# Patient Record
Sex: Male | Born: 1945 | Race: White | Hispanic: No | Marital: Married | Smoking: Former smoker
Health system: Southern US, Community
[De-identification: ages and names within clinical notes are randomized; demographics above are authoritative.]

## PROBLEM LIST (undated history)

## (undated) DIAGNOSIS — F411 Generalized anxiety disorder: Secondary | ICD-10-CM

## (undated) DIAGNOSIS — E559 Vitamin D deficiency, unspecified: Secondary | ICD-10-CM

## (undated) DIAGNOSIS — I4891 Unspecified atrial fibrillation: Secondary | ICD-10-CM

## (undated) DIAGNOSIS — E119 Type 2 diabetes mellitus without complications: Secondary | ICD-10-CM

## (undated) DIAGNOSIS — H6123 Impacted cerumen, bilateral: Secondary | ICD-10-CM

## (undated) DIAGNOSIS — I38 Endocarditis, valve unspecified: Secondary | ICD-10-CM

## (undated) DIAGNOSIS — J309 Allergic rhinitis, unspecified: Secondary | ICD-10-CM

## (undated) DIAGNOSIS — K219 Gastro-esophageal reflux disease without esophagitis: Secondary | ICD-10-CM

## (undated) DIAGNOSIS — J45909 Unspecified asthma, uncomplicated: Secondary | ICD-10-CM

## (undated) DIAGNOSIS — N189 Chronic kidney disease, unspecified: Secondary | ICD-10-CM

## (undated) DIAGNOSIS — E871 Hypo-osmolality and hyponatremia: Secondary | ICD-10-CM

## (undated) DIAGNOSIS — E538 Deficiency of other specified B group vitamins: Secondary | ICD-10-CM

## (undated) DIAGNOSIS — J449 Chronic obstructive pulmonary disease, unspecified: Secondary | ICD-10-CM

## (undated) DIAGNOSIS — E876 Hypokalemia: Secondary | ICD-10-CM

## (undated) DIAGNOSIS — D509 Iron deficiency anemia, unspecified: Secondary | ICD-10-CM

## (undated) DIAGNOSIS — I10 Essential (primary) hypertension: Secondary | ICD-10-CM

## (undated) DIAGNOSIS — E7800 Pure hypercholesterolemia, unspecified: Secondary | ICD-10-CM

## (undated) DIAGNOSIS — H1010 Acute atopic conjunctivitis, unspecified eye: Secondary | ICD-10-CM

## (undated) HISTORY — PX: TONSILLECTOMY: SUR1361

## (undated) HISTORY — DX: Unspecified atrial fibrillation: I48.91

## (undated) HISTORY — DX: Gastro-esophageal reflux disease without esophagitis: K21.9

## (undated) HISTORY — DX: Acute atopic conjunctivitis, unspecified eye: J30.9

## (undated) HISTORY — PX: CHOLECYSTECTOMY: SHX55

## (undated) HISTORY — DX: Acute atopic conjunctivitis, unspecified eye: H10.10

## (undated) HISTORY — DX: Unspecified asthma, uncomplicated: J45.909

## (undated) HISTORY — DX: Essential (primary) hypertension: I10

## (undated) HISTORY — DX: Hypokalemia: E87.6

## (undated) HISTORY — DX: Type 2 diabetes mellitus without complications (CMS HCC): E11.9

## (undated) HISTORY — PX: HX CAROTID ENDARTERECTOMY: SHX38

## (undated) HISTORY — DX: Endocarditis, valve unspecified: I38

## (undated) HISTORY — DX: Generalized anxiety disorder: F41.1

## (undated) HISTORY — DX: Chronic kidney disease, unspecified: N18.9

## (undated) HISTORY — DX: Impacted cerumen, bilateral: H61.23

## (undated) HISTORY — DX: Unspecified atrial fibrillation (CMS HCC): I48.91

## (undated) HISTORY — DX: Pure hypercholesterolemia, unspecified: E78.00

## (undated) HISTORY — DX: Hypo-osmolality and hyponatremia: E87.1

## (undated) HISTORY — DX: Chronic obstructive pulmonary disease, unspecified (CMS HCC): J44.9

## (undated) HISTORY — PX: HX GALL BLADDER SURGERY/CHOLE: SHX55

## (undated) HISTORY — DX: Iron deficiency anemia, unspecified: D50.9

## (undated) HISTORY — DX: Deficiency of other specified B group vitamins: E53.8

## (undated) HISTORY — DX: Vitamin D deficiency, unspecified: E55.9

## (undated) HISTORY — DX: Allergic rhinitis, unspecified: J30.9

## (undated) NOTE — Progress Notes (Signed)
Formatting of this note is different from the original.  Images from the original note were not included.      VASCULAR SURGERY [facility]    Patient Name: Frank Barton  Encounter Date: 02/03/2022  Referring provider: Curcio  Surgeon:  Mitchel Honour, MD    ASSESSMENT     10 y.o. male with history of symptomatic  carotid stenosis. Imaging shows <40 % R/L ICA stenosis. Frank Barton has not had recurrence of symptoms and his imaging after 3 years remains stable. Follow-up as needed, as no concern for plaque buildup or other vascular pathologies.     PLAN     Follow up as needed.  Medication management:  - Statin: rosuvastatin 20 mg  - Antiplatelet and anticoagulation therapy: eliquis 5 mg   - BP and diabetic management per PCP. Goal A1c <7%.    HISTORY OF PRESENT ILLNESS      Frank Barton is a 53 y.o. 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  Patient reports he has been doing well the past 3 years. He endorses some new and increased word finding difficulties over the past 1 year. Additionally, he notes intermittent lightheadedness with standing, which will cause him to loose his balance, but denies falls or difficulty with ambulating. He works outside daily and sometimes with physical activity he notes some chest tightness and shortness of breath. He currently follows with cardiology in [place]. Also notes some RLE edema that occurs sporadically, usually at the end of the summer. Patient denies ischemic symptoms including headache, amaurosis fugax, focal weakness, slurred speech. Currently using chewing tobacco daily, former tobacco smoker. No alcohol or other substance use.     ROS: The balance of 10 systems reviewed is negative except as noted in the HPI     RX/ ALLERGIES/ MED HX/SURG HX/ SOC HX/FAM HX: Reviewed & updated in Epic    Current Outpatient Medications   Medication Instructions    amLODIPine (NORVASC) 2.5 mg, Oral, Daily     azelastine (ASTELIN) 137 mcg (0.1 %) nasal spray 1 spray, Each Nare, 2 times a day (standard), Use in each nostril as directed     beclomethasone (QVAR) 80 mcg/actuation inhaler 2 puffs, Inhalation, 2 times a day (standard)    C,E,ZINC,COPPER 11/OMEGA3S/LUT (OCUVITE ADULT 50+ ORAL) 1 tablet, Oral, Daily (standard)    candesartan (ATACAND) 32 mg, Oral, Daily (standard)    cholecalciferol (vitamin D3-25 mcg (1,000 unit)) (VITAMIN D3) 1,000 Units, Oral, Daily (standard)    cyanocobalamin (VITAMIN B-12) 500 mcg, Oral, Daily (standard)    dronedarone (MULTAQ) 400 mg, Oral, 2 times a day with meals    ELIQUIS 5 mg, Oral, 2 times a day (standard)    fluticasone (FLONASE) 50 mcg/actuation nasal spray 1 spray, Each Nare, Daily (standard)    IRON, FERROUS SULFATE, ORAL 1 tablet, Oral, Daily (standard)    metoprolol succinate (TOPROL-XL) 50 mg, Oral, Daily (standard)    montelukast (SINGULAIR) 10 mg, Oral, Nightly    nitroglycerin (NITROSTAT) 0.4 mg, Sublingual, Every 5 min PRN    omeprazole (PRILOSEC) 40 mg, Oral, Daily (standard)    ranitidine (ZANTAC) 300 mg, Oral, Every evening    rosuvastatin (CRESTOR) 20 mg, Oral, Daily (standard)       PHYSICAL EXAM      Vitals:    02/03/22 1229   BP: 139/52   Pulse: 61   Temp: 36.4 C (97.5 F)     General: well  appearing, no acute distress   Head: normocephalic, atraumatic  Neck: Supple, carotid bruit(s)  absent bilateral  Cardiac: RRR, distant heart sounds  Lungs: Easy work of breathing, CTA Bilaterally  Abdomen: Soft, non-distended, non tender, without mass  MSK: negative joint tenderness, normal gait and ROM  Skin: warm, dry intact, good turgor  Neuro: AAO x 3, appropriate to questions. CN II-XII grossly intact.  Extremities: Upper extremities warm and perfused. Right lower extremity with 1+ pitting edema to the mid-shin, no erythema or tenderness. Left lower extremity  negative for edema, ulceration or digital gangrene.   Pulses: Bilateral radial pulses palpable.    DIAGNOSTICS       Images and reports reviewed independently by Dr. Karie Mainland.     Bilateral Carotid Duplex (02/03/2022):    Labs:  Lab Results   Component Value Date    CREATININE 1.32 (H) 09/15/2016     ______________________________________________________________________    Documentation assistance was provided by Eugenia Pancoast, Scribe, on February 03, 2022 at 10:21 AM. for Galen Daft, PA, and Tomasa Hose, MD    Karna Christmas, MS4  Electronically signed by Boykin Reaper, MD at 02/10/2022  2:54 PM EDT    Associated attestation - Karie Mainland Barnet Pall, MD - 02/10/2022  2:54 PM EDT  Formatting of this note might be different from the original.  I have verified all student documentation or findings.  I have personally performed or re-performed the physical exam and medical decision making.  Katharine L McGinigle

## (undated) NOTE — Progress Notes (Signed)
Formatting of this note is different from the original.    Office Visit    PCP: Lamont Dowdy, NP    Reason for Visit: Medical management of PAF      HPI:    Frank Barton is a 89 y.o. male presents for medical management of PAF. He has a history of paroxysmal atrial fibrillation, coronary calcifications on CT noted in 2010, carotid artery stenosis, asthma, chronic kidney disease, hypertension and hyperlipidemia.    He was last seen in our office on 09/26/21.    Since his last office visit he has done fairly well.  He was seen at [facility] earlier this month with concerns of his heart rate being in the 30s.  The patient reports that he was checking his blood pressure on his blood pressure cuff and pulse ox and noted that at times his heart rate would be in the 30s.  When he arrived at the hospital his EKG did demonstrate sinus rhythm with PVCs.  He contacted our office about the issue and a Zio patch was ordered.  The patient just mailed the Zio patch back yesterday so we do not have the results available.  He tells me that he does feel his heart flutter occasionally.  He has not felt any sustained arrhythmias.  He has been having issues with his asthma and currently has a dry cough.  He did see his asthma physician who gave him a different type of inhaler to use.  His shortness of breath seems to be stable.  He denies PND and orthopnea.  He is not describing any anginal type symptoms.    His blood pressure is well controlled in the office today.  This has been managed by nephrology. He remains on statin therapy and his cholesterol was well controlled when it was checked in August. He did bring labs with him today. His LDL 45    He remains in normal sinus rhythm today on Multaq. He continues on anticoagulation and denies any bleeding issues.    He tries to stay fairly active by doing yard workand feeding his goats.He enjoys hunting and fishing.He owns a home in [place] and goes  there to keep up the property.    He follows with Vascular at [facility] for his carotid disease    Assessment & Plan:    1. PAF (paroxysmal atrial fibrillation) (HCC)  s/p ECV at [facility] 2016, Remains in NSR, on eliquis, multaq    2. Hypertension, unspecified type  Well controlledin the office today. Managed by Nephrology    3. Stenosis of carotid artery, unspecified laterality  s/p R CEA 09/2016, followed at [facility]    4. Coronary artery calcification seen on CT scan  Previous nuclear scan in 2010 was negative.  On crestor--continue with risk modification    5. Mild intermittent asthma without complication  Uses inhalers, followed by Dr Lucie Leather    6. Chronic kidney disease, unspecified CKD stage  Creat 1.56 on labs from 01/14/22. This is followed by Managed by Nephrology Dr Hyman Hopes    7. Other hyperlipidemia  Lipid panel 01/14/22 LDL 34. He will continue on Crestor    8. Anticoagulant long-term use  On eliquis for AF. He denies any bleeding issues    PLAN:    [name] continues to have episodes where he feels occasional palpitations.  His Zio patch that he wore was mailed back yesterday and results are pending.  He does remain in normal sinus rhythm today  by exam.  I will contact the patient with the results of his Zio patch once I have reviewed it.  He did have lab work in August which demonstrated low magnesium level.  I will have him take magnesium oxide 400 mg twice daily for 2 weeks.    He is not describing any anginal type symptoms.    His cholesterol was well controlled when it was checked in August.  His primary care has been managing his hyperlipidemia.    He continues to follow with his nephrologist who manages his chronic kidney disease and hypertension.    I will make no further changes to his medical regimen other than adding magnesium and for 2 weeks.  I will plan to see him again in the office in 4 months unless his monitor is abnormal.    Past Medical History:  Past Medical History:   Diagnosis Date    ? AF (paroxysmal atrial fibrillation) (HCC)    ? Coronary artery disease    ? Diabetes mellitus (HCC)    ? Hyperlipidemia    ? Hypertension      Past Surgical History:  History reviewed. No pertinent surgical history.    Medications:     Current Outpatient Medications:   ?  amLODIPine (NORVASC) 5 MG tablet, Take 1 tablet (5 mg total) by mouth daily., Disp: 90 tablet, Rfl: 3  ?  apixaban (ELIQUIS) 5 mg tablet *ANTICOAGULANT*, Take 1 tablet (5 mg total) by mouth 2 times daily., Disp: 180 tablet, Rfl: 3  ?  azelastine (ASTELIN) 137 mcg (0.1 %) nasal spray, , Disp: , Rfl:   ?  candesartan (ATACAND) 32 MG tablet, Take 1 tablet (32 mg total) by mouth daily., Disp: 90 tablet, Rfl: 3  ?  cyanocobalamin (VITAMIN B12) 500 MCG tablet, Take 1 tablet (500 mcg total) by mouth., Disp: , Rfl:   ?  ergocalciferol (VITAMIN D2) 1,250 mcg (50,000 unit) capsule, TAKE 1 CAPSULE BY MOUTH EVERY WEEK FOR LOW VITAMIN DAILY, Disp: , Rfl:   ?  famotidine (PEPCID) 40 MG tablet, Take 1 tablet (40 mg total) by mouth daily., Disp: , Rfl:   ?  ferrous sulfate (FERATAB) 325 (65 FE) MG tablet, Take 1 tablet (325 mg total) by mouth daily with breakfast., Disp: , Rfl:   ?  fluticasone (FLONASE) 50 mcg/actuation nasal spray, , Disp: , Rfl:   ?  metoPROLOL succinate (TOPROL-XL) 50 MG 24 hr tablet, Take 1 tablet (50 mg total) by mouth daily., Disp: 90 tablet, Rfl: 3  ?  montelukast (SINGULAIR) 10 mg tablet, , Disp: , Rfl:   ?  MULTAQ 400 mg tablet, Take 1 tablet (400 mg total) by mouth 2 times daily with meals., Disp: 180 tablet, Rfl: 3  ?  nitroglycerin (NITROSTAT) 0.4 MG SL tablet, Place 1 tablet (0.4 mg total) under the tongue every 5 (five) minutes as needed for Chest pain. Call doctor/911 if take 2 doses. Max 3/day., Disp: 25 tablet, Rfl: 11  ?  omeprazole (PRILOSEC) 40 MG capsule, , Disp: , Rfl:   ?  QVAR REDIHALER 80 mcg/actuation inhaler, , Disp: , Rfl:   ?  rosuvastatin (CRESTOR) 20 MG tablet, Take 1 tablet (20 mg total) by mouth daily., Disp:  90 tablet, Rfl: 3  ?  XOPENEX HFA 45 mcg/actuation inhaler,  , Disp: , Rfl:     Allergies:   Allergies   Allergen Reactions   ? Latex Shortness Of Breath (ALLERGY/intolerance)   ? Latex, Natural Rubber Shortness Of  Breath (ALLERGY/intolerance)   ? Levaquin [Levofloxacin] Other (See Comments)     Hives    ? Miralax [Polyethylene Glycol 3350] Other (See Comments)     Hives    ? Polyethylene Glycol 300 Urticaria / Hives (ALLERGY)   ? Spiriva Respimat [Tiotropium Bromide] Other (See Comments) and Urticaria / Hives (ALLERGY)     hives     Social History:  Social History     Tobacco Use   Smoking Status Former   ? Years: 5   ? Types: Cigarettes   Smokeless Tobacco Current   ? Types: Chew     Social History     Substance and Sexual Activity   Alcohol Use No     Social History     Substance and Sexual Activity   Drug Use No     Family History:  History reviewed. No pertinent family history.    Review of Systems:   CONSTITUTIONAL:  As per HPI.  HEENT:  Eyes:  No diplopia or blurred vision. ENT:  No earache, sore throat or runny nose.  CARDIOVASCULAR:  No pressure, squeezing, strangling, tightness, heaviness or aching about the chest, neck, axilla or epigastrium.  RESPIRATORY:  No cough, shortness of breath. No PND or orthopnea.  GASTROINTESTINAL:  No nausea, vomiting or diarrhea.  GENITOURINARY:  No dysuria, frequency or urgency.  MUSCULOSKELETAL:  As per HPI.  SKIN:  No change in skin, hair or nails.  NEUROLOGIC:  No paresthesias, fasciculations, seizures or weakness.  PSYCHIATRIC:  No disorder of thought or mood.  ENDOCRINE:  No heat or cold intolerance, polyuria or polydipsia.  HEMATOLOGICAL:  No easy bruising or bleeding.     VITAL SIGNS:   Vitals:    04/03/22 1005   BP: 138/58   Pulse: 75   SpO2: 96%   Height: 1.727 m (5\' 8" )   Weight: 94.8 kg (209 lb)   BMI (Calculated): 31.8     Vitals:    04/03/22 1005   BP: 138/58   Pulse: 75   SpO2: 96%   Height: 1.727 m (5\' 8" )   Weight: 94.8 kg (209 lb)   BMI (Calculated): 31.8      Physical Exam:  GENERAL: pleasant elderly male seated on exam table, Appears well. NAD. A+O X 3   HEENT: Normocephalic, Anicteric.   NECK: supple, Trachea midline, Carotid upstrokes normal No JVD   RESPIRATORY: Good breath sound.  No wheezing or Rhonchi heard  COR: Normal precordium regular rhythm. Normal S1 and S2.   No murmur/rub, or gallops heard.   ABDOMEN: + BS, Soft, Non tender.   VASCULAR: Normal radial,  posterior tibial pulses bilaterally   EXTREMITIES: No cyanosis or clubbing. No edema of lower extremities.   NEURO: normal mental status, no gross focal motor deficit.   SKIN no rash   MUSCLE -SKELETAL :  full range of motion  PSYCH: normal behavior, appropriate.  Test Results  none    Labs:   Sodium   Date/Time Value Ref Range Status   09/27/2020 11:34 AM 135 135 - 146 MMOL/L Final     Potassium   Date/Time Value Ref Range Status   09/27/2020 11:34 AM 4.1 3.5 - 5.3 MMOL/L Final     BUN   Date/Time Value Ref Range Status   09/27/2020 11:34 AM 18 8 - 24 MG/DL Final     Creatinine   Date/Time Value Ref Range Status   09/27/2020 11:34 AM 1.58 (H) 0.50 - 1.50 MG/DL Final  No results found for: "INR", "PROTIME"    Electronically signed by: Evie Lacks, NP 04/03/2022 10:13 AM,   04/03/2022, 10:13 AM      Electronically signed by: Hillery Jacks, NP  04/03/22 1053    Electronically signed by Hillery Jacks, NP at 04/03/2022 10:53 AM EDT

---

## 1993-10-02 ENCOUNTER — Other Ambulatory Visit (HOSPITAL_COMMUNITY): Payer: Self-pay | Admitting: FAMILY PRACTICE

## 2005-01-22 ENCOUNTER — Ambulatory Visit: Payer: Self-pay | Admitting: Family Medicine

## 2005-02-02 ENCOUNTER — Ambulatory Visit: Payer: Self-pay | Admitting: Family Medicine

## 2005-02-10 ENCOUNTER — Ambulatory Visit: Payer: Self-pay | Admitting: Family Medicine

## 2005-04-24 ENCOUNTER — Ambulatory Visit: Payer: Self-pay | Admitting: Family Medicine

## 2015-03-04 DIAGNOSIS — E088 Diabetes mellitus due to underlying condition with unspecified complications: Secondary | ICD-10-CM | POA: Insufficient documentation

## 2015-03-04 DIAGNOSIS — E785 Hyperlipidemia, unspecified: Secondary | ICD-10-CM | POA: Insufficient documentation

## 2015-03-04 DIAGNOSIS — I10 Essential (primary) hypertension: Secondary | ICD-10-CM | POA: Insufficient documentation

## 2015-04-24 ENCOUNTER — Encounter: Payer: Self-pay | Admitting: Allergy and Immunology

## 2015-04-24 ENCOUNTER — Ambulatory Visit (INDEPENDENT_AMBULATORY_CARE_PROVIDER_SITE_OTHER): Payer: Medicare PPO | Admitting: Allergy and Immunology

## 2015-04-24 VITALS — BP 150/60 | HR 80 | Temp 97.5°F | Resp 16 | Ht 67.17 in | Wt 200.6 lb

## 2015-04-24 DIAGNOSIS — H1010 Acute atopic conjunctivitis, unspecified eye: Secondary | ICD-10-CM | POA: Diagnosis not present

## 2015-04-24 DIAGNOSIS — J309 Allergic rhinitis, unspecified: Secondary | ICD-10-CM | POA: Diagnosis not present

## 2015-04-24 DIAGNOSIS — J4531 Mild persistent asthma with (acute) exacerbation: Secondary | ICD-10-CM

## 2015-04-24 DIAGNOSIS — K219 Gastro-esophageal reflux disease without esophagitis: Secondary | ICD-10-CM

## 2015-04-24 DIAGNOSIS — J387 Other diseases of larynx: Secondary | ICD-10-CM | POA: Diagnosis not present

## 2015-04-25 DIAGNOSIS — K219 Gastro-esophageal reflux disease without esophagitis: Secondary | ICD-10-CM | POA: Insufficient documentation

## 2015-04-25 DIAGNOSIS — J4530 Mild persistent asthma, uncomplicated: Secondary | ICD-10-CM | POA: Insufficient documentation

## 2015-04-25 DIAGNOSIS — H1010 Acute atopic conjunctivitis, unspecified eye: Secondary | ICD-10-CM | POA: Insufficient documentation

## 2015-04-25 DIAGNOSIS — J309 Allergic rhinitis, unspecified: Secondary | ICD-10-CM

## 2015-04-25 NOTE — Progress Notes (Signed)
[name]  Follow-up Note  Refering Provider: No ref. provider found Primary Provider: Pcp Not In System  Subjective:   Bob Newman is a 69 y.o. male who returns to the [facility] in re-evaluation of the following:  HPI Comments:  Bob Newman returns to this clinic in evaluation of his asthma and allergic rhinitis and LPR. For the most part he is done quite well since his last visit approximate 5 months ago. Unfortunately, about 2 weeks ago he developed nasal congestion and coughing and phlegm production and possibly a little bit of yellow nasal discharge without any fever or anosmia. He's been using a short acting bronchodilator which has helped his cough somewhat. He has no chest pain or significant chest tightness or shortness of breath. His reflux is been somewhat active even while using Prilosec and ranitidine while he's been sick. Prior to this exacerbation he did not require any systemic steroids since I last seen him in his clinic and he could exert himself without any problem and rarely uses any bronchodilator.   Outpatient Encounter Prescriptions as of 04/24/2015  Medication Sig  . amLODipine (NORVASC) 5 MG tablet   . azelastine (ASTELIN) 0.1 % nasal spray   . candesartan (ATACAND) 32 MG tablet   . Cholecalciferol (VITAMIN D PO) Take by mouth.  . CRESTOR 20 MG tablet   . fluticasone (FLONASE) 50 MCG/ACT nasal spray   . montelukast (SINGULAIR) 10 MG tablet   . omeprazole (PRILOSEC) 20 MG capsule Take 20 mg by mouth daily.  Marland Kitchen. QVAR 80 MCG/ACT inhaler Inhale 2 puffs into the lungs 2 (two) times daily.  . ranitidine (ZANTAC) 300 MG tablet Take 300 mg by mouth daily.  . metoprolol succinate (TOPROL-XL) 50 MG 24 hr tablet    No facility-administered encounter medications on file as of 04/24/2015.    No orders of the defined types were placed in this encounter.    History reviewed. No pertinent past medical  history.  Past Surgical History  Procedure Laterality Date  . Cholecystectomy    . Tonsillectomy      Allergies  Allergen Reactions  . Levaquin [Levofloxacin] Hives  . Spiriva Handihaler [Tiotropium Bromide Monohydrate] Hives    Review of Systems  Constitutional: Negative.  Negative for fever and chills.  HENT: Positive for congestion and postnasal drip. Negative for ear discharge, ear pain, facial swelling, nosebleeds, rhinorrhea, sinus pressure, sneezing, sore throat, tinnitus, trouble swallowing and voice change.   Eyes: Negative.  Negative for pain, discharge, redness and itching.  Respiratory: Positive for cough. Negative for choking, chest tightness, shortness of breath, wheezing and stridor.   Cardiovascular: Negative.  Negative for chest pain and leg swelling.  Gastrointestinal: Negative.  Negative for vomiting and abdominal pain.  Musculoskeletal: Negative.  Negative for myalgias and arthralgias.  Skin: Negative.   Allergic/Immunologic: Negative.   Neurological: Negative for dizziness.  Hematological: Negative for adenopathy.     Objective:   Filed Vitals:   04/24/15 1346  BP: 150/60  Pulse: 80  Temp: 97.5 F (36.4 C)  Resp: 16   Height: 5' 7.16" (170.6 cm)  Weight: 200 lb 9.9 oz (91 kg)   Physical Exam  Constitutional: He appears well-developed and well-nourished. No distress.  HENT:  Head: Normocephalic and atraumatic. Head is without right periorbital erythema and without left periorbital erythema.  Right Ear: Tympanic membrane, external ear and ear canal normal. No drainage or tenderness. No foreign bodies. Tympanic membrane  is not injected, not scarred, not perforated, not erythematous, not retracted and not bulging. No middle ear effusion.  Left Ear: Tympanic membrane, external ear and ear canal normal. No drainage or tenderness. No foreign bodies. Tympanic membrane is not injected, not scarred, not perforated, not erythematous, not retracted and not  bulging.  No middle ear effusion.  Nose: Mucosal edema present. No rhinorrhea, nose lacerations or sinus tenderness.  No foreign bodies.  Mouth/Throat: Oropharynx is clear and moist. No oropharyngeal exudate, posterior oropharyngeal edema, posterior oropharyngeal erythema or tonsillar abscesses.  Eyes: Lids are normal. Right eye exhibits no chemosis, no discharge and no exudate. No foreign body present in the right eye. Left eye exhibits no chemosis, no discharge and no exudate. No foreign body present in the left eye. Right conjunctiva is not injected. Left conjunctiva is not injected.  Neck: Neck supple. No tracheal tenderness present. No tracheal deviation and no edema present. No thyroid mass and no thyromegaly present.  Cardiovascular: Normal rate, regular rhythm, S1 normal and S2 normal.  Exam reveals no gallop.   No murmur heard. Pulmonary/Chest: No accessory muscle usage or stridor. No respiratory distress. He has no wheezes. He has no rhonchi. He has no rales.  Abdominal: Soft.  Lymphadenopathy:       Head (right side): No tonsillar adenopathy present.       Head (left side): No tonsillar adenopathy present.    He has no cervical adenopathy.  Neurological: He is alert.  Skin: No rash noted. He is not diaphoretic.  Psychiatric: He has a normal mood and affect. His behavior is normal.    Diagnostics:    Spirometry was performed and demonstrated an FEV1 of 2.88 at 101 % of predicted.  The patient had an Asthma Control Test with the following results: ACT Total Score: 13.    Assessment and Plan:   1. Mild persistent asthma, with acute exacerbation     1. Prednisone  one time a day for 5 days  2. Continue Qvar 80 two inhalations two times per day. Increase to three inhalations three times per day during 'flare up'  3. Continue montelukast  one time per day  4. Continue flonase 1-2 sprays each nostril one time per day  5. Continue omeprazole 20 in AM + ranitidine 300  in PM  6. Continue Astelin, Proventil HFA if needed.   I will assume Briar will do well with very low-dose prednisone over the course of the next 5 days while we wait for his upper respiratory tract infection that appears to have precipitated a slight asthma flare resolve. If he does well we can see him back in this clinic in 6 months or earlier if there is a problem.    Laurette Schimke, MD [facility]

## 2015-04-25 NOTE — Patient Instructions (Signed)
1. Prednisone 10mg  one time a day for 5 days  2. Continue Qvar 80 two inhalations two times per day. Increase to three inhalations three times per day during 'flare up'  3. Continue montelukast 10mg  one time per day  4. Continue flonase 1-2 sprays each nostril one time per day  5. Continue omeprazole 20 in AM + ranitidine 300 in PM  6. Continue Astelin, Proventil HFA if needed.

## 2015-05-20 ENCOUNTER — Ambulatory Visit: Payer: Self-pay | Admitting: Allergy and Immunology

## 2015-05-20 ENCOUNTER — Ambulatory Visit (INDEPENDENT_AMBULATORY_CARE_PROVIDER_SITE_OTHER): Payer: Medicare PPO | Admitting: Allergy and Immunology

## 2015-05-20 ENCOUNTER — Encounter: Payer: Self-pay | Admitting: Allergy and Immunology

## 2015-05-20 VITALS — BP 140/80 | HR 80 | Temp 97.6°F | Resp 18

## 2015-05-20 DIAGNOSIS — H1010 Acute atopic conjunctivitis, unspecified eye: Secondary | ICD-10-CM | POA: Diagnosis not present

## 2015-05-20 DIAGNOSIS — J4530 Mild persistent asthma, uncomplicated: Secondary | ICD-10-CM

## 2015-05-20 DIAGNOSIS — K219 Gastro-esophageal reflux disease without esophagitis: Secondary | ICD-10-CM

## 2015-05-20 DIAGNOSIS — J309 Allergic rhinitis, unspecified: Secondary | ICD-10-CM | POA: Diagnosis not present

## 2015-05-20 DIAGNOSIS — J387 Other diseases of larynx: Secondary | ICD-10-CM | POA: Diagnosis not present

## 2015-05-20 MED ORDER — OMEPRAZOLE 40 MG PO CPDR: 40.0000 mg | DELAYED_RELEASE_CAPSULE | Freq: Every day | ORAL | Status: DC

## 2015-05-20 MED ORDER — FLUCONAZOLE 150 MG PO TABS: 150.0000 mg | ORAL_TABLET | Freq: Every day | ORAL | Status: DC

## 2015-05-20 NOTE — Progress Notes (Signed)
[facility]  Follow-up Note  Refering Provider: No ref. provider found Primary Provider: Pcp Not In System  Subjective:   Bob Newman is a 69 y.o. male who [facility] in re-evaluation of the following:  HPI Comments:  Bob Newman returns to this clinic on 05/20/2015 in reevaluation of his asthma, allergic rhinitis, and LPR. He still continues to have coughing especially in the morning upon awakening and in the evening. He coughs so hard that he retches. He gets about 20 minutes of this cough every morning. He does not have any vomiting. He does not have a tremendous amount of upper airway symptoms although he still feels somewhat congested. He does have lots of throat clearing and mucus stuck in his throat. Using a bronchodilator does not alleviate his symptoms. He's been consistently using all medical therapy as previously prescribed including Qvar, montelukast, omeprazole, and some Flonase. He was not particularly impressed that Flonase and Astelin was having him very much so he stopped his medications. He denies any fever or anosmia or ugly sputum production or ugly nasal discharge. He does not have any chest pain and he has no obvious reflux symptoms.   Outpatient Encounter Prescriptions as of 05/20/2015  Medication Sig  . amLODipine (NORVASC) 5 MG tablet   . amoxicillin-clavulanate (AUGMENTIN) 875-125 MG tablet Take 1 tablet by mouth 2 (two) times daily.   Marland Kitchen azelastine (ASTELIN) 0.1 % nasal spray   . BREO ELLIPTA 200-25 MCG/INH AEPB   . candesartan (ATACAND) 32 MG tablet   . Cholecalciferol (VITAMIN D PO) Take by mouth.  . CRESTOR 20 MG tablet   . fluticasone (FLONASE) 50 MCG/ACT nasal spray   . metoprolol succinate (TOPROL-XL) 50 MG 24 hr tablet   . montelukast (SINGULAIR) 10 MG tablet   . omeprazole (PRILOSEC) 20 MG capsule Take 20 mg by mouth daily.  Marland Kitchen QVAR 80 MCG/ACT inhaler Inhale 2 puffs into  the lungs 2 (two) times daily.  . ranitidine (ZANTAC) 300 MG tablet Take 300 mg by mouth daily.  . VENTOLIN HFA 108 (90 BASE) MCG/ACT inhaler   . fluconazole (DIFLUCAN) 150 MG tablet Take 1 tablet (150 mg total) by mouth daily.  Marland Kitchen omeprazole (PRILOSEC) 40 MG capsule Take 1 capsule (40 mg total) by mouth daily.   No facility-administered encounter medications on file as of 05/20/2015.    Meds ordered this encounter  Medications  . omeprazole (PRILOSEC) 40 MG capsule    Sig: Take 1 capsule (40 mg total) by mouth daily.    Dispense:  30 capsule    Refill:  5  . fluconazole (DIFLUCAN) 150 MG tablet    Sig: Take 1 tablet (150 mg total) by mouth daily.    Dispense:  3 tablet    Refill:  0    History reviewed. No pertinent past medical history.  Past Surgical History  Procedure Laterality Date  . Cholecystectomy    . Tonsillectomy      Allergies  Allergen Reactions  . Levaquin [Levofloxacin] Hives  . Spiriva Handihaler [Tiotropium Bromide Monohydrate] Hives    Review of Systems  Constitutional: Negative for fever, chills and fatigue.  HENT: Positive for congestion and rhinorrhea. Negative for ear pain, facial swelling, hearing loss, nosebleeds, postnasal drip, sinus pressure, sneezing, sore throat, tinnitus, trouble swallowing and voice change.   Eyes: Negative for pain, discharge, redness and itching.  Respiratory: Positive for cough and shortness of breath. Negative for  chest tightness and wheezing.   Cardiovascular: Negative for chest pain and leg swelling.  Gastrointestinal: Negative for nausea, vomiting and abdominal pain.  Endocrine: Negative for cold intolerance and heat intolerance.  Musculoskeletal: Negative for myalgias and arthralgias.  Skin: Negative for rash.  Allergic/Immunologic: Negative.   Neurological: Negative for dizziness and headaches.  Hematological: Negative for adenopathy.     Objective:   Filed Vitals:   05/20/15 1431  BP: 140/80  Pulse: 80   Temp: 97.6 F (36.4 C)  Resp: 18          Physical Exam  Constitutional: He appears well-developed and well-nourished. No distress.  HENT:  Head: Normocephalic and atraumatic. Head is without right periorbital erythema and without left periorbital erythema.  Right Ear: Tympanic membrane, external ear and ear canal normal. No drainage or tenderness. No foreign bodies. Tympanic membrane is not injected, not scarred, not perforated, not erythematous, not retracted and not bulging. No middle ear effusion.  Left Ear: Tympanic membrane, external ear and ear canal normal. No drainage or tenderness. No foreign bodies. Tympanic membrane is not injected, not scarred, not perforated, not erythematous, not retracted and not bulging.  No middle ear effusion.  Nose: Nose normal. No mucosal edema, rhinorrhea, nose lacerations or sinus tenderness.  No foreign bodies.  Mouth/Throat: Oropharynx is clear and moist. No oropharyngeal exudate, posterior oropharyngeal edema, posterior oropharyngeal erythema or tonsillar abscesses.  Eyes: Lids are normal. Right eye exhibits no chemosis, no discharge and no exudate. No foreign body present in the right eye. Left eye exhibits no chemosis, no discharge and no exudate. No foreign body present in the left eye. Right conjunctiva is not injected. Left conjunctiva is not injected.  Neck: Neck supple. No tracheal tenderness present. No tracheal deviation and no edema present. No thyroid mass and no thyromegaly present.  Cardiovascular: Normal rate, regular rhythm, S1 normal and S2 normal.  Exam reveals no gallop.   No murmur heard. Pulmonary/Chest: No accessory muscle usage or stridor. No respiratory distress. He has no wheezes. He has no rhonchi. He has no rales.  Abdominal: Soft.  Lymphadenopathy:       Head (right side): No tonsillar adenopathy present.       Head (left side): No tonsillar adenopathy present.    He has no cervical adenopathy.  Neurological: He is  alert.  Skin: No rash noted. He is not diaphoretic.  Psychiatric: He has a normal mood and affect. His behavior is normal.    Diagnostics:    Spirometry was performed and demonstrated an FEV1 of 2.8 to at 99 % of predicted.  The patient had an Asthma Control Test with the following results:  .    Assessment and Plan:   1. Mild persistent asthma, uncomplicated   2. LPRD (laryngopharyngeal reflux disease)   3. Allergic rhinoconjunctivitis      1. Diflucan 150 mg tablet 1 time a day for 3 days  2. Increase reflux therapy:   A. increase omeprazole to 40 mg one time per day in a.m.  B. add ranitidine 300 mg one time per day in p.m.  3. Use nasal saline multiple times a day  4. Use Mucinex DM 2 tablets twice a day  5. Restart Flonase one spray each nostril twice a day  6. Continue Qvar 80 2 inhalations twice a day  7. Continue montelukast 10 mg daily  8. Continue Proventil HFA if needed  9. May add OTC Zyrtec one tablet one time per day if needed  10. Return in 4 weeks or earlier if problem  I will assume that [name] may have some fungal overgrowth of his airway given the fact that he recently had systemic steroids and does continue to use inhaled steroids and his lack of response to good asthma therapy. As well, we will cover him for a component of reflux-induced respiratory disease by increasing his omeprazole and giving him some ranitidine at night time. We'll see what happens over the course the next several weeks. He'll contact me should he not improve during this timeframe.    Laurette SchimkeEric Margel Joens, MD [facility]

## 2015-05-20 NOTE — Patient Instructions (Signed)
  1. Diflucan 150 mg tablet 1 time a day for 3 days  2. Increase reflux therapy:   A. increase omeprazole to 40 mg one time per day in a.m.  B. add ranitidine 300 mg one time per day in p.m.  3. Use nasal saline multiple times a day  4. Use Mucinex DM 2 tablets twice a day  5. Restart Flonase one spray each nostril twice a day  6. Continue Qvar 80 2 inhalations twice a day  7. Continue montelukast 10 mg daily  8. Continue Proventil HFA if needed  9. May add OTC Zyrtec one tablet one time per day if needed  10. Return in 4 weeks or earlier if problem

## 2015-06-24 ENCOUNTER — Encounter: Payer: Self-pay | Admitting: Allergy and Immunology

## 2015-06-24 ENCOUNTER — Ambulatory Visit (INDEPENDENT_AMBULATORY_CARE_PROVIDER_SITE_OTHER): Payer: Medicare PPO | Admitting: Allergy and Immunology

## 2015-06-24 VITALS — BP 132/70 | HR 72 | Resp 20

## 2015-06-24 DIAGNOSIS — J309 Allergic rhinitis, unspecified: Secondary | ICD-10-CM | POA: Diagnosis not present

## 2015-06-24 DIAGNOSIS — J4530 Mild persistent asthma, uncomplicated: Secondary | ICD-10-CM | POA: Diagnosis not present

## 2015-06-24 DIAGNOSIS — J019 Acute sinusitis, unspecified: Secondary | ICD-10-CM

## 2015-06-24 DIAGNOSIS — H1010 Acute atopic conjunctivitis, unspecified eye: Secondary | ICD-10-CM

## 2015-06-24 DIAGNOSIS — J387 Other diseases of larynx: Secondary | ICD-10-CM | POA: Diagnosis not present

## 2015-06-24 DIAGNOSIS — K219 Gastro-esophageal reflux disease without esophagitis: Secondary | ICD-10-CM

## 2015-06-24 MED ORDER — METHYLPREDNISOLONE ACETATE 80 MG/ML IJ SUSP
80.0000 mg | Freq: Once | INTRAMUSCULAR | Status: AC
  Administered 2015-06-24: 80 mg via INTRAMUSCULAR

## 2015-06-24 MED ORDER — AMOXICILLIN-POT CLAVULANATE 875-125 MG PO TABS: ORAL_TABLET | ORAL | Status: DC

## 2015-06-24 NOTE — Patient Instructions (Signed)
  1. Augmentin 875 one tablet twice a day for the next 20 days  2. Depo-Medrol 80 IM now  3. Get a chest x-ray  4. Continue reflux therapy:   A. increase omeprazole to 40 mg one time per day in a.m.  B. add ranitidine 300 mg one time per day in p.m.  5. Use nasal saline multiple times a day  6. Use Mucinex DM 2 tablets twice a day  7. Flonase one spray each nostril twice a day. Stop Afrin nose sprays  8. Continue Qvar 80 2 inhalations twice a day  9. Continue montelukast 10 mg daily  10. Continue Proventil HFA if needed  11. May add OTC Zyrtec one tablet one time per day if needed  12. Return in 4 weeks or earlier if problem

## 2015-06-24 NOTE — Progress Notes (Signed)
[facility]  Follow-up Note  Referring Provider: No ref. provider found Primary Provider: Pcp Not In System Date of Office Visit: 06/24/2015  Subjective:   Bob Newman is a 70 y.o. male who [facility] to the [facility] in re-evaluation of the following:  HPI Comments:  Bob SailsHarry returns to this clinic on  06/24/2015 in reevaluation of his breathing problems. He still continues to have issues with coughing fits. He coughs for at least 20 minutes if not 60 minutes and has gagging and retching. He gets short of breath during these episodes. As well, he has had persistent issues with nasal congestion. He sometimes has issues with not being able to smell very well. He's now using an over-the-counter nasal decongestant spray. He has not had any chest pain. He is not really had much improvement with medical therapy administered in the past. He thinks he does do better on antibiotics.   Current Outpatient Prescriptions on File Prior to Visit  Medication Sig Dispense Refill  . amLODipine (NORVASC) 5 MG tablet     . azelastine (ASTELIN) 0.1 % nasal spray     . BREO ELLIPTA 200-25 MCG/INH AEPB     . candesartan (ATACAND) 32 MG tablet     . Cholecalciferol (VITAMIN D PO) Take by mouth.    . CRESTOR 20 MG tablet     . fluticasone (FLONASE) 50 MCG/ACT nasal spray     . metoprolol succinate (TOPROL-XL) 50 MG 24 hr tablet     . montelukast (SINGULAIR) 10 MG tablet     . omeprazole (PRILOSEC) 40 MG capsule Take 1 capsule (40 mg total) by mouth daily. 30 capsule 5  . QVAR 80 MCG/ACT inhaler Inhale 2 puffs into the lungs 2 (two) times daily.    . ranitidine (ZANTAC) 300 MG tablet Take 300 mg by mouth daily.    . VENTOLIN HFA 108 (90 BASE) MCG/ACT inhaler      No current facility-administered medications on file prior to visit.    No orders of the defined types were placed in this encounter.    No past medical history on  file.  Past Surgical History  Procedure Laterality Date  . Cholecystectomy    . Tonsillectomy      Allergies  Allergen Reactions  . Levaquin [Levofloxacin] Hives  . Spiriva Handihaler [Tiotropium Bromide Monohydrate] Hives    Review of systems negative except as noted in HPI / PMHx or noted below:  Review of Systems  Constitutional: Negative.   HENT: Negative.   Eyes: Negative.   Respiratory: Negative.   Cardiovascular: Negative.   Gastrointestinal: Negative.   Genitourinary: Negative.   Musculoskeletal: Negative.   Skin: Negative.   Neurological: Negative.   Endo/Heme/Allergies: Negative.   Psychiatric/Behavioral: Negative.      Objective:   Filed Vitals:   06/24/15 1128  BP: 132/70  Pulse: 72  Resp: 20          Physical Exam  Constitutional: He is well-developed, well-nourished, and in no distress. No distress.  HENT:  Head: Normocephalic.  Right Ear: Tympanic membrane, external ear and ear canal normal.  Left Ear: Tympanic membrane, external ear and ear canal normal.  Nose: Nose normal. No mucosal edema or rhinorrhea.  Mouth/Throat: Uvula is midline, oropharynx is clear and moist and mucous membranes are normal. No oropharyngeal exudate.  Eyes: Conjunctivae are normal.  Neck: Trachea normal. No tracheal tenderness present. No tracheal deviation present. No thyromegaly  present.  Cardiovascular: Normal rate, regular rhythm, S1 normal, S2 normal and normal heart sounds.   No murmur heard. Pulmonary/Chest: Breath sounds normal. No stridor. No respiratory distress. He has no wheezes. He has no rales.  Musculoskeletal: He exhibits no edema.  Lymphadenopathy:       Head (right side): No tonsillar adenopathy present.       Head (left side): No tonsillar adenopathy present.    He has no cervical adenopathy.    He has no axillary adenopathy.  Neurological: He is alert. Gait normal.  Skin: No rash noted. He is not diaphoretic. No erythema. Nails show no  clubbing.  Psychiatric: Mood and affect normal.    Diagnostics:    Spirometry was performed and demonstrated an FEV1 of 2.63 at 2.98 % of predicted.  The patient had an Asthma Control Test with the following results: ACT Total Score: 15.    Assessment and Plan:   1. Mild persistent asthma, uncomplicated   2. LPRD (laryngopharyngeal reflux disease)   3. Allergic rhinoconjunctivitis   4. Acute sinusitis, recurrence not specified, unspecified location     1. Augmentin 875 one tablet twice a day for the next 20 days  2. Depo-Medrol 80 IM now  3. Get a chest x-ray  4. Continue reflux therapy:   A. increase omeprazole to 40 mg one time per day in a.m.  B. add ranitidine 300 mg one time per day in p.m.  5. Use nasal saline multiple times a day  6. Use Mucinex DM 2 tablets twice a day  7. Flonase one spray each nostril twice a day. Stop Afrin nose sprays  8. Continue Qvar 80 2 inhalations twice a day  9. Continue montelukast 10 mg daily  10. Continue Proventil HFA if needed  11. May add OTC Zyrtec one tablet one time per day if needed  12. Return in 4 weeks or earlier if problem  I'm going to get Korey prolonged antibiotic administration assuming he may have a component of sinusitis driving some of his respiratory tract irritation. As well, I gave him his injection of systemic steroid to calm down any inflammation that may be present in his respiratory tract. He's already maximally treated for reflux-induced respiratory disease at this point in time. We'll see how things go in the next 4 weeks. I'll contact him about his chest x-ray.  Laurette Schimke, MD [facility]

## 2015-06-26 ENCOUNTER — Telehealth: Payer: Self-pay | Admitting: *Deleted

## 2015-06-26 ENCOUNTER — Encounter: Payer: Self-pay | Admitting: Allergy and Immunology

## 2015-06-26 NOTE — Telephone Encounter (Signed)
Called to inform patient of his normal chest X-ray results per Dr. Lucie Leather.

## 2015-07-08 DIAGNOSIS — I480 Paroxysmal atrial fibrillation: Secondary | ICD-10-CM | POA: Insufficient documentation

## 2015-07-22 ENCOUNTER — Ambulatory Visit (INDEPENDENT_AMBULATORY_CARE_PROVIDER_SITE_OTHER): Payer: Medicare PPO | Admitting: Allergy and Immunology

## 2015-07-22 ENCOUNTER — Encounter: Payer: Self-pay | Admitting: Allergy and Immunology

## 2015-07-22 VITALS — BP 148/70 | HR 80 | Resp 18

## 2015-07-22 DIAGNOSIS — J387 Other diseases of larynx: Secondary | ICD-10-CM | POA: Diagnosis not present

## 2015-07-22 DIAGNOSIS — J4530 Mild persistent asthma, uncomplicated: Secondary | ICD-10-CM | POA: Diagnosis not present

## 2015-07-22 DIAGNOSIS — K219 Gastro-esophageal reflux disease without esophagitis: Secondary | ICD-10-CM

## 2015-07-22 DIAGNOSIS — H1010 Acute atopic conjunctivitis, unspecified eye: Secondary | ICD-10-CM

## 2015-07-22 DIAGNOSIS — J309 Allergic rhinitis, unspecified: Secondary | ICD-10-CM | POA: Diagnosis not present

## 2015-07-22 NOTE — Patient Instructions (Addendum)
  1. Continue reflux therapy:   A. omeprazole to 40 mg one time per day in a.m.  B. ranitidine 300 mg one time per day in p.m.  2. Use nasal saline multiple times a day if needed  3. Use Mucinex DM 2 tablets twice a day if needed  4. Flonase one spray each nostril twice a day plus astelin two sprays each nostril two times per day  5. Continue Qvar 80 2 inhalations twice a day  6. Continue montelukast 10 mg daily  7. Continue Proventil HFA if needed  8. May add OTC Zyrtec one tablet one time per day if needed  9. Return in 12 weeks or earlier if problem

## 2015-07-22 NOTE — Progress Notes (Signed)
Follow-up Note  Referring Provider: No ref. provider found Primary Provider: Pcp Not In System Date of Office Visit: 07/22/2015  Subjective:   Bob Newman (DOB: 1945/08/05) is a 70 y.o. male who [facility] the [facility] on 07/22/2015 in re-evaluation of the following:  HPI Comments: Bob Newman returns to this clinic in reevaluation of his asthma and allergic rhinoconjunctivitis and LPR. His respiratory tract is actually been doing quite well and he had very little problems with either his nose or chest pain his reflux is been under good control since his treatment for a prolonged episode of sinusitis with Augmentin for 20 days when I last saw him in his clinic.. This past weekend he was outdoors for both Saturday and Sunday and it was very windy and he did develop some nasal stuffiness. There are other medical issues that have developed with Bob Newman over the course of the past month including the development of atrial fibrillation about 2 weeks ago that required him to go to the emergency room where his blood pressure was over 220 and a pulse rate of 160 and he was given Ativan as well as other medications and had a stress test apparently was normal. He then was treated with an anticoagulant but unfortunately developed a GI bleed requiring another admission to the hospital and did upper endoscopy performed by Dr. Charm Newman in scheduling for a colonoscopy next week. He's no longer on an anticoagulant.   Current Outpatient Prescriptions on File Prior to Visit  Medication Sig Dispense Refill  . amLODipine (NORVASC) 5 MG tablet 5 mg daily.     Marland Kitchen azelastine (ASTELIN) 0.1 % nasal spray     . candesartan (ATACAND) 32 MG tablet     . Cholecalciferol (VITAMIN D PO) Take by mouth.    . CRESTOR 20 MG tablet     . fluticasone (FLONASE) 50 MCG/ACT nasal spray     . montelukast (SINGULAIR) 10 MG tablet Take 10 mg by mouth daily.     Marland Kitchen omeprazole (PRILOSEC) 40 MG capsule Take 1 capsule (40 mg  total) by mouth daily. 30 capsule 5  . QVAR 80 MCG/ACT inhaler Inhale 2 puffs into the lungs 2 (two) times daily.    . ranitidine (ZANTAC) 300 MG tablet Take 300 mg by mouth daily.    . VENTOLIN HFA 108 (90 BASE) MCG/ACT inhaler Inhale two puffs every four to six hours as needed for cough or wheeze.     No current facility-administered medications on file prior to visit.     Current outpatient prescriptions:  .  amLODipine (NORVASC) 5 MG tablet, 5 mg daily. , Disp: , Rfl:  .  azelastine (ASTELIN) 0.1 % nasal spray, , Disp: , Rfl:  .  candesartan (ATACAND) 32 MG tablet, , Disp: , Rfl:  .  Cholecalciferol (VITAMIN D PO), Take by mouth., Disp: , Rfl:  .  CRESTOR 20 MG tablet, , Disp: , Rfl:  .  fluticasone (FLONASE) 50 MCG/ACT nasal spray, , Disp: , Rfl:  .  metoprolol succinate (TOPROL-XL) 25 MG 24 hr tablet, Take 25 mg by mouth 2 (two) times daily., Disp: , Rfl: 0 .  montelukast (SINGULAIR) 10 MG tablet, Take 10 mg by mouth daily. , Disp: , Rfl:  .  NITROGLYCERIN SL, Place under the tongue as needed., Disp: , Rfl:  .  omeprazole (PRILOSEC) 40 MG capsule, Take 1 capsule (40 mg total) by mouth daily., Disp: 30 capsule, Rfl: 5 .  QVAR 80  MCG/ACT inhaler, Inhale 2 puffs into the lungs 2 (two) times daily., Disp: , Rfl:  .  ranitidine (ZANTAC) 300 MG tablet, Take 300 mg by mouth daily., Disp: , Rfl:  .  VENTOLIN HFA 108 (90 BASE) MCG/ACT inhaler, Inhale two puffs every four to six hours as needed for cough or wheeze., Disp: , Rfl:  .  pantoprazole (PROTONIX) 40 MG tablet, Take 40 mg by mouth 2 (two) times daily. Reported on 07/22/2015, Disp: , Rfl: 0 .  SUPREP BOWEL PREP SOLN, Reported on 07/22/2015, Disp: , Rfl: 0  No orders of the defined types were placed in this encounter.    Past Medical History  Diagnosis Date  . Asthma   . Atrial fibrillation (HCC)   . GERD (gastroesophageal reflux disease)   . High blood pressure   . Allergic rhinoconjunctivitis     Past Surgical History    Procedure Laterality Date  . Cholecystectomy    . Tonsillectomy      Allergies  Allergen Reactions  . Levaquin [Levofloxacin] Hives  . Spiriva Handihaler [Tiotropium Bromide Monohydrate] Hives    Review of systems negative except as noted in HPI / PMHx or noted below:  Review of Systems  Constitutional: Negative.   HENT: Negative.   Eyes: Negative.   Respiratory: Negative.   Cardiovascular: Negative.   Gastrointestinal: Negative.   Genitourinary: Negative.   Musculoskeletal: Negative.   Skin: Negative.   Neurological: Negative.   Endo/Heme/Allergies: Negative.   Psychiatric/Behavioral: Negative.      Objective:   Filed Vitals:   07/22/15 1136  BP: 148/70  Pulse: 80  Resp: 18          Physical Exam  Constitutional: He is well-developed, well-nourished, and in no distress.  HENT:  Head: Normocephalic.  Right Ear: Tympanic membrane, external ear and ear canal normal.  Left Ear: Tympanic membrane, external ear and ear canal normal.  Nose: Mucosal edema present. No rhinorrhea.  Mouth/Throat: Uvula is midline, oropharynx is clear and moist and mucous membranes are normal. No oropharyngeal exudate.  Eyes: Conjunctivae are normal.  Neck: Trachea normal. No tracheal tenderness present. No tracheal deviation present. No thyromegaly present.  Cardiovascular: Normal rate, regular rhythm, S1 normal, S2 normal and normal heart sounds.   No murmur heard. Pulmonary/Chest: Breath sounds normal. No stridor. No respiratory distress. He has no wheezes. He has no rales.  Musculoskeletal: He exhibits no edema.  Lymphadenopathy:       Head (right side): No tonsillar adenopathy present.       Head (left side): No tonsillar adenopathy present.    He has no cervical adenopathy.    He has no axillary adenopathy.  Neurological: He is alert. Gait normal.  Skin: No rash noted. He is not diaphoretic. No erythema. Nails show no clubbing.  Psychiatric: Mood and affect normal.     Diagnostics:    Spirometry was performed and demonstrated an FEV1 of 3.14 at 107 % of predicted.  The patient had an Asthma Control Test with the following results: ACT Total Score: 17.    Assessment and Plan:   1. Mild persistent asthma, uncomplicated   2. Allergic rhinoconjunctivitis   3. LPRD (laryngopharyngeal reflux disease)     1. Continue reflux therapy:   A. increase omeprazole to 40 mg one time per day in a.m.  B. add ranitidine 300 mg one time per day in p.m.  2. Use nasal saline multiple times a day  3. Use Mucinex DM 2 tablets twice a  day  4. Flonase one spray each nostril twice a day plus astelin two sprays each nostril two times per day  5. Continue Qvar 80 2 inhalations twice a day  6. Continue montelukast 10 mg daily  7. Continue Proventil HFA if needed  8. May add OTC Zyrtec one tablet one time per day if needed  9. Return in 12 weeks or earlier if problem  From a respiratory and allergic standpoint Suhan is doing relatively well and were not going to change around any of his medical therapy at this point in time. Certainly if his springtime is not very kind to him and he develop significant problems with either his upper or lower airways will need to change her plan. He will keep in contact with me noting his response should he get out of control and we'll see him back in this clinic in 12 weeks if he does well.   Laurette Schimke, MD [facility]

## 2015-08-12 ENCOUNTER — Telehealth: Payer: Self-pay | Admitting: Allergy and Immunology

## 2015-08-12 NOTE — Telephone Encounter (Signed)
He has a billing question. He says that he has been paying all his bills as he gets them and pays his copays at the time of his visit. He says that he just received a bill for charges in July of 2016 and he wants to know why and what it is for and why he is just receiving a bill for it.

## 2015-08-12 NOTE — Telephone Encounter (Signed)
MEDICAL MGR BALANCE - WILL SEND COPY OF EOB

## 2015-10-14 ENCOUNTER — Ambulatory Visit (INDEPENDENT_AMBULATORY_CARE_PROVIDER_SITE_OTHER): Payer: Medicare PPO | Admitting: Allergy and Immunology

## 2015-10-14 ENCOUNTER — Encounter: Payer: Self-pay | Admitting: Allergy and Immunology

## 2015-10-14 VITALS — BP 140/70 | HR 80 | Resp 16

## 2015-10-14 DIAGNOSIS — H1010 Acute atopic conjunctivitis, unspecified eye: Secondary | ICD-10-CM

## 2015-10-14 DIAGNOSIS — J4530 Mild persistent asthma, uncomplicated: Secondary | ICD-10-CM | POA: Diagnosis not present

## 2015-10-14 DIAGNOSIS — J309 Allergic rhinitis, unspecified: Secondary | ICD-10-CM | POA: Diagnosis not present

## 2015-10-14 DIAGNOSIS — J387 Other diseases of larynx: Secondary | ICD-10-CM | POA: Diagnosis not present

## 2015-10-14 DIAGNOSIS — K219 Gastro-esophageal reflux disease without esophagitis: Secondary | ICD-10-CM

## 2015-10-14 MED ORDER — TIOTROPIUM BROMIDE MONOHYDRATE 1.25 MCG/ACT IN AERS: 2.0000 | INHALATION_SPRAY | Freq: Every day | RESPIRATORY_TRACT | Status: DC

## 2015-10-14 MED ORDER — BUDESONIDE-FORMOTEROL FUMARATE 160-4.5 MCG/ACT IN AERO: 2.0000 | INHALATION_SPRAY | Freq: Two times a day (BID) | RESPIRATORY_TRACT | Status: DC

## 2015-10-14 NOTE — Patient Instructions (Addendum)
  1. Continue reflux therapy:   A. omeprazole to 40 mg one time per day in a.m.  B. ranitidine 300 mg one time per day in p.m.  2. Continue Flonase one spray each nostril twice a day plus astelin two sprays each nostril two times per day  3. Replace qvar with symbicort 180 two inhalations two times per day  4. Continue montelukast 10 mg daily  5. Continue Proventil HFA if needed  6. Continue Zyrtec one tablet one time per day if needed  7. Continue nasal saline multiple times a day if needed  8. Continue Mucinex DM 2 tablets twice a day if needed  9. Return in 4 weeks or earlier if problem

## 2015-10-14 NOTE — Progress Notes (Signed)
Follow-up Note  Referring Provider: No ref. provider found Primary Provider: Pcp Not In System Date of Office Visit: 10/14/2015  Subjective:   Bob Newman (DOB: 1945-12-01) is a 70 y.o. male [facility] in re-evaluation of the following:  HPI: Bob Newman returns to this clinic noting that he still continues to have issues with cough. He still makes sputum production. He occasionally still has an issue with feelings of a glob in his throat. He has very little issues with his nose other than the fact that it does get congested on occasion but he can smell and taste without any problem. He's been very good about consistently using medical therapy including therapy directed against both inflammation of his respiratory tract with Qvar and Flonase and therapy against reflux with a combination of omeprazole and ranitidine. He uses a short acting bronchodilator which does appear to help him somewhat. He remains away from long-acting bronchodilators because of his atrial fibrillation.    Medication List           amLODipine 5 MG tablet  Commonly known as:  NORVASC  5 mg daily.     azelastine 0.1 % nasal spray  Commonly known as:  ASTELIN  Place 2 sprays into both nostrils 2 (two) times daily.     candesartan 32 MG tablet  Commonly known as:  ATACAND  Take 32 mg by mouth daily.     CRESTOR 20 MG tablet  Generic drug:  rosuvastatin  Take 20 mg by mouth daily.     ELIQUIS 5 MG Tabs tablet  Generic drug:  apixaban     fluticasone 50 MCG/ACT nasal spray  Commonly known as:  FLONASE  Place 2 sprays into both nostrils 2 (two) times daily.     metoprolol succinate 25 MG 24 hr tablet  Commonly known as:  TOPROL-XL  Take 25 mg by mouth 2 (two) times daily.     montelukast 10 MG tablet  Commonly known as:  SINGULAIR  Take 10 mg by mouth daily.     nitroGLYCERIN 0.4 MG SL tablet  Commonly known as:  NITROSTAT     omeprazole 40 MG  capsule  Commonly known as:  PRILOSEC  Take 1 capsule (40 mg total) by mouth daily.     QVAR 80 MCG/ACT inhaler  Generic drug:  beclomethasone  Inhale 2 puffs into the lungs 2 (two) times daily.     ranitidine 300 MG tablet  Commonly known as:  ZANTAC  Take 300 mg by mouth daily.     VENTOLIN HFA 108 (90 Base) MCG/ACT inhaler  Generic drug:  albuterol  Inhale two puffs every four to six hours as needed for cough or wheeze.     VITAMIN D PO  Take by mouth daily.        Past Medical History  Diagnosis Date  . Asthma   . Atrial fibrillation (HCC)   . GERD (gastroesophageal reflux disease)   . High blood pressure   . Allergic rhinoconjunctivitis     Past Surgical History  Procedure Laterality Date  . Cholecystectomy    . Tonsillectomy      Allergies  Allergen Reactions  . Levaquin [Levofloxacin] Hives  . Spiriva Handihaler [Tiotropium Bromide Monohydrate] Hives    Review of systems negative except as noted in HPI / PMHx or noted below:  Review of Systems  Constitutional: Negative.   HENT: Negative.   Eyes: Negative.  Respiratory: Negative.   Cardiovascular: Negative.   Gastrointestinal: Negative.   Genitourinary: Negative.   Musculoskeletal: Negative.   Skin: Negative.   Neurological: Negative.   Endo/Heme/Allergies: Negative.   Psychiatric/Behavioral: Negative.      Objective:   Filed Vitals:   10/14/15 1111  BP: 140/70  Pulse: 80  Resp: 16          Physical Exam  Constitutional: He is well-developed, well-nourished, and in no distress.  HENT:  Head: Normocephalic.  Right Ear: Tympanic membrane, external ear and ear canal normal.  Left Ear: Tympanic membrane, external ear and ear canal normal.  Nose: Nose normal. No mucosal edema (Left nasal congestion) or rhinorrhea.  Mouth/Throat: Uvula is midline, oropharynx is clear and moist and mucous membranes are normal. No oropharyngeal exudate.  Eyes: Conjunctivae are normal.  Neck: Trachea  normal. No tracheal tenderness present. No tracheal deviation present. No thyromegaly present.  Cardiovascular: Normal rate, regular rhythm, S1 normal, S2 normal and normal heart sounds.   No murmur heard. Pulmonary/Chest: Breath sounds normal. No stridor. No respiratory distress. He has no wheezes. He has no rales.  Musculoskeletal: He exhibits no edema.  Lymphadenopathy:       Head (right side): No tonsillar adenopathy present.       Head (left side): No tonsillar adenopathy present.    He has no cervical adenopathy.  Neurological: He is alert. Gait normal.  Skin: No rash noted. He is not diaphoretic. No erythema. Nails show no clubbing.  Psychiatric: Mood and affect normal.    Diagnostics:    Spirometry was performed and demonstrated an FEV1 of 2.55 at 87 % of predicted.  The patient had an Asthma Control Test with the following results: ACT Total Score: 17.    Assessment and Plan:   1. Mild persistent asthma, uncomplicated   2. Allergic rhinoconjunctivitis   3. LPRD (laryngopharyngeal reflux disease)     1. Continue reflux therapy:   A. omeprazole 40 mg one time per day in a.m.  B. ranitidine 300 mg one time per day in p.m.  2. Continue Flonase one spray each nostril twice a day plus astelin two sprays each nostril two times per day  3. Start Symicort 160 two inhaltions two times per day to replace Qvar  4. Continue montelukast 10 mg daily  5. Continue Proventil HFA if needed  6. Continue Zyrtec one tablet one time per day if needed  7. Continue nasal saline multiple times a day if needed  8. Continue Mucinex DM 2 tablets twice a day if needed  9. Return in 4 weeks or earlier if problem  If possible I would like to control Branch's respiratory tract symptoms without the addition of a long-acting bronchodilator but we have been unsuccessful in controlling his disease state with just Qvar and he can not tolerate Spiriva and thus will start him on formoterol in  addition to an inhaled steroid and continue to have him use therapy directed against his upper airway disease and reflux and regroup with him in approximately 4 weeks or earlier if there is a problem.  Laurette Schimke, MD [facility]

## 2015-11-22 ENCOUNTER — Ambulatory Visit (INDEPENDENT_AMBULATORY_CARE_PROVIDER_SITE_OTHER): Payer: Medicare PPO | Admitting: Allergy and Immunology

## 2015-11-22 VITALS — BP 110/70 | HR 80 | Resp 16

## 2015-11-22 DIAGNOSIS — J309 Allergic rhinitis, unspecified: Secondary | ICD-10-CM | POA: Diagnosis not present

## 2015-11-22 DIAGNOSIS — H1010 Acute atopic conjunctivitis, unspecified eye: Secondary | ICD-10-CM

## 2015-11-22 DIAGNOSIS — J387 Other diseases of larynx: Secondary | ICD-10-CM

## 2015-11-22 DIAGNOSIS — J4540 Moderate persistent asthma, uncomplicated: Secondary | ICD-10-CM

## 2015-11-22 DIAGNOSIS — K219 Gastro-esophageal reflux disease without esophagitis: Secondary | ICD-10-CM

## 2015-11-22 NOTE — Progress Notes (Signed)
Follow-up Note  Referring Provider: No ref. provider found Primary Provider: Pcp Not In System Date of Office Visit: 11/22/2015  Subjective:   Bob Newman (DOB: 1945-10-25) is a 70 y.o. 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 He unfortunately developed an episode of atrial fibrillation last Friday at around 10 AM with a heart rate of about 130 that left him very weak for many hours. He resolved this issue around 9 PM. Should be noted that this happened soon after using a dose of Qvar. He did not use a short acting bronchodilator around the time that he developed his atrial fibrillation.  He continues to have recurrent bouts of cough and congestion in his chest that doesn't really respond to any of the therapy that he has had administered in the past. He was intolerant of using Symbicort because it gave rise to what sounds like vertigo. He has failed all other types of inhalers in the past including anticholinergic agents.  His nose is been doing pretty well as long as he continues to use his medical therapy as prescribed. His reflux is under good control as well while consistently using medical therapy.    Medication List           amLODipine 5 MG tablet  Commonly known as:  NORVASC  5 mg daily.     azelastine 0.1 % nasal spray  Commonly known as:  ASTELIN  Place 2 sprays into both nostrils 2 (two) times daily.     budesonide-formoterol 160-4.5 MCG/ACT inhaler  Commonly known as:  SYMBICORT  Inhale 2 puffs into the lungs 2 (two) times daily.     candesartan 32 MG tablet  Commonly known as:  ATACAND  Take 32 mg by mouth daily.     CRESTOR 20 MG tablet  Generic drug:  rosuvastatin  Take 20 mg by mouth daily.     ELIQUIS 5 MG Tabs tablet  Generic drug:  apixaban     fluticasone 50 MCG/ACT nasal spray    Commonly known as:  FLONASE  Place 2 sprays into both nostrils 2 (two) times daily.     metoprolol succinate 25 MG 24 hr tablet  Commonly known as:  TOPROL-XL  Take 25 mg by mouth 2 (two) times daily.     montelukast 10 MG tablet  Commonly known as:  SINGULAIR  Take 10 mg by mouth daily.     nitroGLYCERIN 0.4 MG SL tablet  Commonly known as:  NITROSTAT     omeprazole 40 MG capsule  Commonly known as:  PRILOSEC  Take 1 capsule (40 mg total) by mouth daily.     QVAR 80 MCG/ACT inhaler  Generic drug:  beclomethasone  Inhale 2 puffs into the lungs 2 (two) times daily.     ranitidine 300 MG tablet  Commonly known as:  ZANTAC  Take 300 mg by mouth daily.     VENTOLIN HFA 108 (90 Base) MCG/ACT inhaler  Generic drug:  albuterol  Inhale two puffs every four to six hours as needed for cough or wheeze.     VITAMIN D PO  Take by mouth daily.        Past Medical History  Diagnosis Date  . Asthma   . Atrial fibrillation (HCC)   . GERD (gastroesophageal reflux disease)   . High blood  pressure   . Allergic rhinoconjunctivitis     Past Surgical History  Procedure Laterality Date  . Cholecystectomy    . Tonsillectomy      Allergies  Allergen Reactions  . Levaquin [Levofloxacin] Hives  . Spiriva Handihaler [Tiotropium Bromide Monohydrate] Hives    Review of systems negative except as noted in HPI / PMHx or noted below:  Review of Systems  Constitutional: Negative.   HENT: Negative.   Eyes: Negative.   Respiratory: Negative.   Cardiovascular: Negative.   Gastrointestinal: Negative.   Genitourinary: Negative.   Musculoskeletal: Negative.   Skin: Negative.   Neurological: Negative.   Endo/Heme/Allergies: Negative.   Psychiatric/Behavioral: Negative.      Objective:   Filed Vitals:   11/22/15 1054  BP: 110/70  Pulse: 80  Resp: 16          Physical Exam  Constitutional: He is well-developed, well-nourished, and in no distress.  HENT:  Head:  Normocephalic.  Right Ear: Tympanic membrane, external ear and ear canal normal.  Left Ear: Tympanic membrane, external ear and ear canal normal.  Nose: Nose normal. No mucosal edema or rhinorrhea.  Mouth/Throat: Uvula is midline, oropharynx is clear and moist and mucous membranes are normal. No oropharyngeal exudate.  Eyes: Conjunctivae are normal.  Neck: Trachea normal. No tracheal tenderness present. No tracheal deviation present. No thyromegaly present.  Cardiovascular: Normal rate, regular rhythm, S1 normal, S2 normal and normal heart sounds.   No murmur heard. Pulmonary/Chest: Breath sounds normal. No stridor. No respiratory distress. He has no wheezes. He has no rales.  Musculoskeletal: He exhibits no edema.  Lymphadenopathy:       Head (right side): No tonsillar adenopathy present.       Head (left side): No tonsillar adenopathy present.    He has no cervical adenopathy.  Neurological: He is alert. Gait normal.  Skin: No rash noted. He is not diaphoretic. No erythema. Nails show no clubbing.  Psychiatric: Mood and affect normal.    Diagnostics:    Spirometry was performed and demonstrated an FEV1 of 2.29 at 78 % of predicted.  The patient had an Asthma Control Test with the following results: ACT Total Score: 19.    Assessment and Plan:   1. Asthma, moderate persistent, well-controlled   2. Allergic rhinoconjunctivitis   3. LPRD (laryngopharyngeal reflux disease)      1. Continue reflux therapy:   A. omeprazole to 40 mg one time per day in a.m.  B. ranitidine 300 mg one time per day in p.m.  2. Continue Flonase one spray each nostril twice a day plus astelin two sprays each nostril two times per day  3. Restart Qvar 80 2 inhalations twice a day with spacer  4. Continue montelukast 10 mg daily  5. Continue Proventil HFA if needed  6. Continue Zyrtec one tablet one time per day if needed  7. Continue nasal saline multiple times a day if needed  8. Continue  Mucinex DM 2 tablets twice a day if needed  9. Check IgE level. Xolair?  9. Return in 12 weeks or earlier if problem  I'm going to see if Bob Newman is a candidate for omalizumab administration as he is basically failed medical therapy and is intolerant of multiple forms of antiasthmatic medications. If his IgE level is within range we'll be starting him on Xolair as he did demonstrate hypersensitivity to house dust mite in the past.  Laurette SchimkeEric Vee Bahe, MD [facility]

## 2015-11-22 NOTE — Patient Instructions (Signed)
  1. Continue reflux therapy:   A. omeprazole to 40 mg one time per day in a.m.  B. ranitidine 300 mg one time per day in p.m.  2. Continue Flonase one spray each nostril twice a day plus astelin two sprays each nostril two times per day  3. Restart Qvar 80 2 inhalations twice a day with spacer  4. Continue montelukast 10 mg daily  5. Continue Proventil HFA if needed  6. Continue Zyrtec one tablet one time per day if needed  7. Continue nasal saline multiple times a day if needed  8. Continue Mucinex DM 2 tablets twice a day if needed  9. Check IgE level. Xolair?  9. Return in 12 weeks or earlier if problem

## 2015-11-23 LAB — IGE: IgE (Immunoglobulin E), Serum: 52 IU/mL (ref 0–100)

## 2015-11-25 ENCOUNTER — Encounter: Payer: Self-pay | Admitting: Allergy and Immunology

## 2015-12-04 ENCOUNTER — Telehealth: Payer: Self-pay | Admitting: *Deleted

## 2015-12-04 ENCOUNTER — Other Ambulatory Visit: Payer: Self-pay | Admitting: *Deleted

## 2015-12-04 MED ORDER — ALBUTEROL SULFATE HFA 108 (90 BASE) MCG/ACT IN AERS: INHALATION_SPRAY | RESPIRATORY_TRACT | Status: DC

## 2015-12-04 MED ORDER — MONTELUKAST SODIUM 10 MG PO TABS: ORAL_TABLET | ORAL | Status: DC

## 2015-12-04 MED ORDER — OMEPRAZOLE 40 MG PO CPDR: DELAYED_RELEASE_CAPSULE | ORAL | Status: DC

## 2015-12-04 MED ORDER — QVAR 80 MCG/ACT IN AERS: INHALATION_SPRAY | RESPIRATORY_TRACT | Status: DC

## 2015-12-04 MED ORDER — RANITIDINE HCL 300 MG PO TABS: ORAL_TABLET | ORAL | Status: DC

## 2015-12-04 NOTE — Telephone Encounter (Signed)
Bob Newman CAME INTO THE OFFICE WANTING TO KNOW IF WE COULD REFILL EVERY SINGLE ONE OF HIS MEDICATIONS. PHARMACY IS [facility] [address] [place], [phone]

## 2015-12-04 NOTE — Telephone Encounter (Signed)
REFILLS SENT TO PHARMACY

## 2015-12-23 ENCOUNTER — Ambulatory Visit (INDEPENDENT_AMBULATORY_CARE_PROVIDER_SITE_OTHER): Payer: Medicare PPO | Admitting: *Deleted

## 2015-12-23 DIAGNOSIS — J4540 Moderate persistent asthma, uncomplicated: Secondary | ICD-10-CM | POA: Diagnosis not present

## 2015-12-23 MED ORDER — OMALIZUMAB 150 MG ~~LOC~~ SOLR
300.0000 mg | SUBCUTANEOUS | Status: DC
  Administered 2015-12-23 – 2016-04-13 (×4): 300 mg via SUBCUTANEOUS

## 2016-01-20 ENCOUNTER — Ambulatory Visit (INDEPENDENT_AMBULATORY_CARE_PROVIDER_SITE_OTHER): Payer: Medicare PPO | Admitting: *Deleted

## 2016-01-20 DIAGNOSIS — J4540 Moderate persistent asthma, uncomplicated: Secondary | ICD-10-CM

## 2016-02-17 ENCOUNTER — Ambulatory Visit (INDEPENDENT_AMBULATORY_CARE_PROVIDER_SITE_OTHER): Payer: Medicare PPO | Admitting: Allergy and Immunology

## 2016-02-17 ENCOUNTER — Encounter: Payer: Self-pay | Admitting: Allergy and Immunology

## 2016-02-17 VITALS — BP 148/76 | HR 80 | Resp 20

## 2016-02-17 DIAGNOSIS — J387 Other diseases of larynx: Secondary | ICD-10-CM | POA: Diagnosis not present

## 2016-02-17 DIAGNOSIS — J3089 Other allergic rhinitis: Secondary | ICD-10-CM

## 2016-02-17 DIAGNOSIS — J4540 Moderate persistent asthma, uncomplicated: Secondary | ICD-10-CM

## 2016-02-17 DIAGNOSIS — K219 Gastro-esophageal reflux disease without esophagitis: Secondary | ICD-10-CM

## 2016-02-17 NOTE — Patient Instructions (Signed)
  1. Continue reflux therapy:   A. omeprazole to 40 mg one time per day in a.m.  B. ranitidine 300 mg one time per day in p.m.  2. Continue Flonase one spray each nostril twice a day plus astelin two sprays each nostril two times per day  3. Continue Qvar 80 2 inhalations twice a day with spacer  4. Continue montelukast 10 mg daily  5. Continue Proventil HFA if needed  6. Continue Zyrtec one tablet one time per day if needed  7. Continue nasal saline multiple times a day if needed  8. Continue Mucinex DM 2 tablets twice a day if needed  9. Continue Xolair and Epi-Pen  10. Return in January 2018 or earlier if problem  11. Obtain fall flu vaccine

## 2016-02-17 NOTE — Progress Notes (Signed)
Follow-up Note  Referring Provider: No ref. provider found Primary Provider: Pcp Not In System Date of Office Visit: 02/17/2016  Subjective:   Bob Newman (DOB: 17-Jan-1946) is a 70 y.o. 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 I last saw him in his clinic in June 2017.  During the interval he has not required a systemic steroid or an antibiotic for his respiratory tract condition. He does not use a short acting bronchodilator and can exercise without any difficulty. He has started Xolair without any incident. He is able to tolerate Qvar without any problem.  Saturday he did develop problems with nasal congestion and sneezing and some congestion and a little bit of cough. His wife developed a similar issue on the same day. Apparently he was visiting multiple people because of a family funeral while visiting [place] during that timeframe. He is actually a little bit better today than he was yesterday. He has not had any high fever or ugly nasal discharge or ugly sputum production or others associated systemic or constitutional symptoms.  His reflux is under excellent control at this point in time.  He underwent cardioversion for his atrial fibrillation last month which apparently was successful.    Medication List      albuterol 108 (90 Base) MCG/ACT inhaler Commonly known as:  VENTOLIN HFA Inhale two puffs every four to six hours as needed for cough or wheeze.   amLODipine 5 MG tablet Commonly known as:  NORVASC 5 mg daily.   azelastine 0.1 % nasal spray Commonly known as:  ASTELIN Place 2 sprays into both nostrils 2 (two) times daily.   candesartan 32 MG tablet Commonly known as:  ATACAND Take 32 mg by mouth daily.   CRESTOR 20 MG tablet Generic drug:  rosuvastatin Take 20 mg by mouth daily.     dronedarone 400 MG tablet Commonly known as:  MULTAQ Take 400 mg by mouth 2 (two) times daily with a meal.   ELIQUIS 5 MG Tabs tablet Generic drug:  apixaban   fluticasone 50 MCG/ACT nasal spray Commonly known as:  FLONASE Place 2 sprays into both nostrils 2 (two) times daily.   metoprolol succinate 25 MG 24 hr tablet Commonly known as:  TOPROL-XL Take 50 mg by mouth 2 (two) times daily.   montelukast 10 MG tablet Commonly known as:  SINGULAIR TAKE ONE TABLET ONCE DAILY   nitroGLYCERIN 0.4 MG SL tablet Commonly known as:  NITROSTAT   omeprazole 40 MG capsule Commonly known as:  PRILOSEC TAKE ONE CAPSULE BEFORE BREAKFAST DAILY   QVAR 80 MCG/ACT inhaler Generic drug:  beclomethasone INHALE TWO PUFFS TWICE DAILY TO PREVENT COUGH OR WHEEZE   ranitidine 300 MG tablet Commonly known as:  ZANTAC TAKE ONE TABLET EACH EVENING DAILY   VITAMIN D PO Take by mouth daily.       Past Medical History:  Diagnosis Date  . Allergic rhinoconjunctivitis   . Asthma   . Atrial fibrillation (HCC)   . GERD (gastroesophageal reflux disease)   . High blood pressure     Past Surgical History:  Procedure Laterality Date  . CHOLECYSTECTOMY    . TONSILLECTOMY      Allergies  Allergen Reactions  . Levaquin [Levofloxacin] Hives  . Miralax [Polyethylene Glycol] Hives  . Spiriva Handihaler [Tiotropium Bromide Monohydrate] Hives  Review of systems negative except as noted in HPI / PMHx or noted below:  Review of Systems  Constitutional: Negative.   HENT: Negative.   Eyes: Negative.   Respiratory: Negative.   Cardiovascular: Negative.   Gastrointestinal: Negative.   Genitourinary: Negative.   Musculoskeletal: Negative.   Skin: Negative.   Neurological: Negative.   Endo/Heme/Allergies: Negative.   Psychiatric/Behavioral: Negative.      Objective:   Vitals:   02/17/16 1123  BP: (!) 148/76  Pulse: 80  Resp: 20          Physical Exam  Constitutional: He is  well-developed, well-nourished, and in no distress.  HENT:  Head: Normocephalic.  Right Ear: Tympanic membrane, external ear and ear canal normal.  Left Ear: Tympanic membrane, external ear and ear canal normal.  Nose: Nose normal. No mucosal edema or rhinorrhea.  Mouth/Throat: Uvula is midline, oropharynx is clear and moist and mucous membranes are normal. No oropharyngeal exudate.  Eyes: Conjunctivae are normal.  Neck: Trachea normal. No tracheal tenderness present. No tracheal deviation present. No thyromegaly present.  Cardiovascular: Normal rate, regular rhythm, S1 normal, S2 normal and normal heart sounds.   No murmur heard. Pulmonary/Chest: Breath sounds normal. No stridor. No respiratory distress. He has no wheezes. He has no rales.  Musculoskeletal: He exhibits no edema.  Lymphadenopathy:       Head (right side): No tonsillar adenopathy present.       Head (left side): No tonsillar adenopathy present.    He has no cervical adenopathy.  Neurological: He is alert. Gait normal.  Skin: No rash noted. He is not diaphoretic. No erythema. Nails show no clubbing.  Psychiatric: Mood and affect normal.    Diagnostics:    Spirometry was performed and demonstrated an FEV1 of 2.38 at 85 % of predicted.  The patient had an Asthma Control Test with the following results: ACT Total Score: 20.    Assessment and Plan:   1. LPRD (laryngopharyngeal reflux disease)   2. Moderate persistent asthma, uncomplicated   3. Other allergic rhinitis     1. Continue reflux therapy:   A. omeprazole 40 mg one time per day in a.m.  B. ranitidine 300 mg one time per day in p.m.  2. Continue Flonase one spray each nostril twice a day plus astelin two sprays each nostril two times per day  3. Continue Qvar 80 2 inhalations twice a day with spacer  4. Continue montelukast 10 mg daily  5. Continue Proventil HFA if needed  6. Continue Zyrtec one tablet one time per day if needed  7. Continue nasal  saline multiple times a day if needed  8. Continue Mucinex DM 2 tablets twice a day if needed  9. Continue Xolair and Epi-Pen  10. Return in January 2018 or earlier if problem  11. Obtain fall flu vaccine  Shaheem appears to be doing relatively well from a respiratory standpoint although certainly it does appear as though he has contracted a upper respiratory tract infection recently. I'm not going to have him alter his therapy at this point time for this issue and just assume that this will resolve over the course of the next several days. He does appear to have developed a very good response to the administration of Xolair and his inhaled steroid regarding his COPD with asthma and his nose is doing relatively well on Flonase and Astelin and his reflux appears to be doing quite well on his omeprazole and ranitidine. If he does  well over the course of the next several months I will see him back in this clinic in January. I did encourage him to obtain a flu vaccine before the beginning of November.  Laurette SchimkeEric Kozlow, MD [facility]

## 2016-02-25 ENCOUNTER — Telehealth: Payer: Self-pay | Admitting: Allergy and Immunology

## 2016-02-25 ENCOUNTER — Other Ambulatory Visit: Payer: Self-pay | Admitting: *Deleted

## 2016-02-25 ENCOUNTER — Telehealth: Payer: Self-pay | Admitting: *Deleted

## 2016-02-25 MED ORDER — AZITHROMYCIN 500 MG PO TABS: ORAL_TABLET | ORAL | 0 refills | Status: DC

## 2016-02-25 MED ORDER — PREDNISONE 10 MG PO TABS: ORAL_TABLET | ORAL | 0 refills | Status: DC

## 2016-02-25 MED ORDER — AMOXICILLIN-POT CLAVULANATE 875-125 MG PO TABS: 1.0000 | ORAL_TABLET | Freq: Two times a day (BID) | ORAL | 0 refills | Status: AC

## 2016-02-25 NOTE — Telephone Encounter (Signed)
Calling with an update.  Bob Newman is not felling well. He is congested, coughing, sore throat.  Wants an antibiotic.  [facility] [address]

## 2016-02-25 NOTE — Telephone Encounter (Signed)
Please provide patient azithromycin 500 mg one tablet once a day for 3 days and prednisone 10 mg one tablet once a day for 3 days

## 2016-02-25 NOTE — Telephone Encounter (Signed)
Called patient and advised change of rx from azithromycin to augmentin due to drug interaction. Rx sent

## 2016-02-25 NOTE — Telephone Encounter (Signed)
Patient informed, [facility]

## 2016-02-25 NOTE — Telephone Encounter (Signed)
-----   Message from Jessica PriestEric J Kozlow, MD sent at 02/25/2016  4:58 PM EDT ----- Please change azithromycin to augmentin 875 twice a day for 10 days given Multaq and azithromycin interaction.

## 2016-03-16 ENCOUNTER — Ambulatory Visit (INDEPENDENT_AMBULATORY_CARE_PROVIDER_SITE_OTHER): Payer: Medicare PPO | Admitting: *Deleted

## 2016-03-16 DIAGNOSIS — J4540 Moderate persistent asthma, uncomplicated: Secondary | ICD-10-CM

## 2016-04-13 ENCOUNTER — Ambulatory Visit (INDEPENDENT_AMBULATORY_CARE_PROVIDER_SITE_OTHER): Payer: Medicare PPO | Admitting: *Deleted

## 2016-04-13 DIAGNOSIS — J4540 Moderate persistent asthma, uncomplicated: Secondary | ICD-10-CM

## 2016-04-24 ENCOUNTER — Encounter: Payer: Self-pay | Admitting: Allergy and Immunology

## 2016-04-24 ENCOUNTER — Telehealth: Payer: Self-pay | Admitting: *Deleted

## 2016-04-24 ENCOUNTER — Ambulatory Visit (INDEPENDENT_AMBULATORY_CARE_PROVIDER_SITE_OTHER): Payer: Medicare PPO | Admitting: Allergy and Immunology

## 2016-04-24 VITALS — BP 138/74 | HR 80 | Resp 18

## 2016-04-24 DIAGNOSIS — J4540 Moderate persistent asthma, uncomplicated: Secondary | ICD-10-CM

## 2016-04-24 DIAGNOSIS — J3089 Other allergic rhinitis: Secondary | ICD-10-CM | POA: Diagnosis not present

## 2016-04-24 DIAGNOSIS — K219 Gastro-esophageal reflux disease without esophagitis: Secondary | ICD-10-CM

## 2016-04-24 NOTE — Progress Notes (Signed)
Follow-up Note  Referring Provider: No ref. provider found Primary Provider: Pcp Not In System Date of Office Visit: 04/24/2016  Subjective:   Bob Newman (DOB: 27-Jul-1945) is a 70 y.o. male [facility] in re-evaluation of the following:  HPI: Bob Newman presents this clinic in reevaluation of his asthma and allergic rhinitis and reflux. I last saw him in this clinic in September 2017.  The big concern for Bob Newman today is the fact that he believes he is developing side effects from the use of omalizumab. He states that he has had about 6 episodes of sudden onset of air hunger that lasts about 6 hours and he believes that this is secondary to his injections. There is not really a temporal relationship between his injections and the development of this issue. He has no associated systemic or constitutional symptoms. If he sits down to try and relax he gets much worse. He needs to pace up and down the hallway to get relief of this issue. He does not have any associated chest pain or palpitations or nausea or other symptoms. A short acting bronchodilator does not help this issue. He is firmly convinced that it is because of his injections that this is occurring.    Medication List      albuterol 108 (90 Base) MCG/ACT inhaler Commonly known as:  VENTOLIN HFA Inhale two puffs every four to six hours as needed for cough or wheeze.   amLODipine 5 MG tablet Commonly known as:  NORVASC 5 mg daily.   azelastine 0.1 % nasal spray Commonly known as:  ASTELIN Place 2 sprays into both nostrils 2 (two) times daily.   candesartan 32 MG tablet Commonly known as:  ATACAND Take 32 mg by mouth daily.   CRESTOR 20 MG tablet Generic drug:  rosuvastatin Take 20 mg by mouth daily.   dronedarone 400 MG tablet Commonly known as:  MULTAQ Take 400 mg by mouth 2 (two) times daily with a meal.   ELIQUIS 5 MG Tabs tablet Generic drug:  apixaban     fluticasone 50 MCG/ACT nasal spray Commonly known as:  FLONASE Place 2 sprays into both nostrils 2 (two) times daily.   metoprolol succinate 25 MG 24 hr tablet Commonly known as:  TOPROL-XL Take 50 mg by mouth 2 (two) times daily.   montelukast 10 MG tablet Commonly known as:  SINGULAIR TAKE ONE TABLET ONCE DAILY   nitroGLYCERIN 0.4 MG SL tablet Commonly known as:  NITROSTAT   omeprazole 40 MG capsule Commonly known as:  PRILOSEC TAKE ONE CAPSULE BEFORE BREAKFAST DAILY   predniSONE 10 MG tablet Commonly known as:  DELTASONE Take one tablet once daily for 3 days   QVAR 80 MCG/ACT inhaler Generic drug:  beclomethasone INHALE TWO PUFFS TWICE DAILY TO PREVENT COUGH OR WHEEZE   ranitidine 300 MG tablet Commonly known as:  ZANTAC TAKE ONE TABLET EACH EVENING DAILY   VITAMIN D PO Take by mouth daily.       Past Medical History:  Diagnosis Date  . Allergic rhinoconjunctivitis   . Asthma   . Atrial fibrillation (HCC)   . GERD (gastroesophageal reflux disease)   . High blood pressure     Past Surgical History:  Procedure Laterality Date  . CHOLECYSTECTOMY    . TONSILLECTOMY      Allergies  Allergen Reactions  . Levaquin [Levofloxacin] Hives  . Miralax [Polyethylene Glycol] Hives  . Spiriva Handihaler [Tiotropium Bromide  Monohydrate] Hives    Review of systems negative except as noted in HPI / PMHx or noted below:  Review of Systems  Constitutional: Negative.   HENT: Negative.   Eyes: Negative.   Respiratory: Negative.   Cardiovascular: Negative.   Gastrointestinal: Negative.   Genitourinary: Negative.   Musculoskeletal: Negative.   Skin: Negative.   Neurological: Negative.   Endo/Heme/Allergies: Negative.   Psychiatric/Behavioral: Negative.      Objective:   Vitals:   04/24/16 0838  BP: 138/74  Pulse: 80  Resp: 18          Physical Exam  Constitutional: He is well-developed, well-nourished, and in no distress.  HENT:  Head:  Normocephalic.  Right Ear: Tympanic membrane, external ear and ear canal normal.  Left Ear: Tympanic membrane, external ear and ear canal normal.  Nose: Nose normal. No mucosal edema or rhinorrhea.  Mouth/Throat: Uvula is midline, oropharynx is clear and moist and mucous membranes are normal. No oropharyngeal exudate.  Eyes: Conjunctivae are normal.  Neck: Trachea normal. No tracheal tenderness present. No tracheal deviation present. No thyromegaly present.  Cardiovascular: Normal rate, regular rhythm, S1 normal, S2 normal and normal heart sounds.   No murmur heard. Pulmonary/Chest: Breath sounds normal. No stridor. No respiratory distress. He has no wheezes. He has no rales.  Musculoskeletal: He exhibits no edema.  Lymphadenopathy:       Head (right side): No tonsillar adenopathy present.       Head (left side): No tonsillar adenopathy present.    He has no cervical adenopathy.  Neurological: He is alert. Gait normal.  Skin: No rash noted. He is not diaphoretic. No erythema. Nails show no clubbing.  Psychiatric: Mood and affect normal.    Diagnostics:    Spirometry was performed and demonstrated an FEV1 of 2.63 at 94 % of predicted.    Assessment and Plan:   1. Moderate persistent asthma without complication   2. Other allergic rhinitis   3. LPRD (laryngopharyngeal reflux disease)     1. Continue reflux therapy:   A. omeprazole to 40 mg one time per day in a.m.  B. ranitidine 300 mg one time per day in p.m.  2. Continue Flonase one spray each nostril twice a day plus astelin two sprays each nostril two times per day  3. Continue Qvar 80 2 inhalations twice a day with spacer  4. Continue montelukast 10 mg daily  5. Continue Proventil HFA if needed  6. Continue Zyrtec one tablet one time per day if needed  7. Continue nasal saline multiple times a day if needed  8. Continue Mucinex DM 2 tablets twice a day if needed  9. Discontinue Xolair  10. Return in January 2018  or earlier if problem  I am not really sure what is going on with Bob Newman and his episodes of air hunger although they do sound as though they're tied up somewhat with anxiety and panic. I have asked Bob Newman to check his pulse rate when he develops another one of these issues. He does have a blood pressure monitor at home and it will also measure pulse rate and he will attach this device to his body and see what his pulse rate is during one of these episodes. It is quite possible he is having some type of arrhythmia and having a pulse rate reading would be very helpful. I am going to stop his Xolair at this point in time. Although this has definitely helped his atopic respiratory disease he  is firmly convinced that Xolair is causing him a problem and thus we'll discontinue this medication. We'll see what happens over the course of the next several months.  Laurette Schimke, MD [facility]

## 2016-04-24 NOTE — Patient Instructions (Signed)
  1. Continue reflux therapy:   A. omeprazole to 40 mg one time per day in a.m.  B. ranitidine 300 mg one time per day in p.m.  2. Continue Flonase one spray each nostril twice a day plus astelin two sprays each nostril two times per day  3. Continue Qvar 80 2 inhalations twice a day with spacer  4. Continue montelukast 10 mg daily  5. Continue Proventil HFA if needed  6. Continue Zyrtec one tablet one time per day if needed  7. Continue nasal saline multiple times a day if needed  8. Continue Mucinex DM 2 tablets twice a day if needed  9. Discontinue Xolair  10. Return in January 2018 or earlier if problem

## 2016-04-24 NOTE — Telephone Encounter (Signed)
PT just wanted to inform you that he didn't see any mucinex dm, only regular mucinex.

## 2016-04-27 ENCOUNTER — Ambulatory Visit: Payer: Medicare PPO | Admitting: Allergy and Immunology

## 2016-05-12 ENCOUNTER — Encounter: Payer: Self-pay | Admitting: Allergy and Immunology

## 2016-06-15 ENCOUNTER — Ambulatory Visit (INDEPENDENT_AMBULATORY_CARE_PROVIDER_SITE_OTHER): Payer: Medicare PPO | Admitting: Allergy and Immunology

## 2016-06-15 ENCOUNTER — Ambulatory Visit: Payer: Medicare PPO | Admitting: Allergy and Immunology

## 2016-06-15 ENCOUNTER — Encounter: Payer: Self-pay | Admitting: Allergy and Immunology

## 2016-06-15 VITALS — BP 122/72 | HR 76 | Resp 16

## 2016-06-15 DIAGNOSIS — J4540 Moderate persistent asthma, uncomplicated: Secondary | ICD-10-CM

## 2016-06-15 DIAGNOSIS — J3089 Other allergic rhinitis: Secondary | ICD-10-CM

## 2016-06-15 DIAGNOSIS — J0120 Acute ethmoidal sinusitis, unspecified: Secondary | ICD-10-CM

## 2016-06-15 DIAGNOSIS — K219 Gastro-esophageal reflux disease without esophagitis: Secondary | ICD-10-CM | POA: Diagnosis not present

## 2016-06-15 MED ORDER — AMOXICILLIN-POT CLAVULANATE 875-125 MG PO TABS: ORAL_TABLET | ORAL | 0 refills | Status: DC

## 2016-06-15 NOTE — Progress Notes (Signed)
Follow-up Note  Referring Provider: No ref. provider found Primary Provider: Pcp Not In System Date of Office Visit: 06/15/2016  Subjective:   Bob Newman (DOB: 18-Nov-1945) is a [age] y.o. male who [facility] in re-evaluation of the following:  HPI: Bob Newman returns to this clinic in reevaluation of an event that has occurred over the course of the past 8 days. He's developed nasal congestion and green nasal discharge and anosmia and this has been progressive. He ass also developed a little bit of slight cough but does not believe that his asthma has flared with this event. His asthma for the most part was doing relatively well prior to this event and he did not require systemic steroid and rarely used a short-acting bronchodilator.  His reflux has been under excellent control.  He has not had any more of his episodes where he would develop acute dyspneic events. He believes that ever since he stopped his Xolair these reactions have resolved.  Allergies as of 06/15/2016      Reactions   Levaquin [levofloxacin] Hives   Miralax [polyethylene Glycol] Hives   Spiriva Handihaler [tiotropium Bromide Monohydrate] Hives      Medication List      albuterol 108 (90 Base) MCG/ACT inhaler Commonly known as:  VENTOLIN HFA Inhale two puffs every four to six hours as needed for cough or wheeze.   amLODipine 5 MG tablet Commonly known as:  NORVASC 5 mg daily.   azelastine 0.1 % nasal spray Commonly known as:  ASTELIN Place 2 sprays into both nostrils 2 (two) times daily.   candesartan 32 MG tablet Commonly known as:  ATACAND Take 32 mg by mouth daily.   CRESTOR 20 MG tablet Generic drug:  rosuvastatin Take 20 mg by mouth daily.   dronedarone 400 MG tablet Commonly known as:  MULTAQ Take 400 mg by mouth 2 (two) times daily with a meal.   ELIQUIS 5 MG Tabs tablet Generic drug:  apixaban   fluticasone 50 MCG/ACT nasal spray Commonly  known as:  FLONASE Place 2 sprays into both nostrils 2 (two) times daily.   IRON PO Take by mouth.   metoprolol succinate 25 MG 24 hr tablet Commonly known as:  TOPROL-XL Take 50 mg by mouth 2 (two) times daily.   montelukast 10 MG tablet Commonly known as:  SINGULAIR TAKE ONE TABLET ONCE DAILY   nitroGLYCERIN 0.4 MG SL tablet Commonly known as:  NITROSTAT   omeprazole 40 MG capsule Commonly known as:  PRILOSEC TAKE ONE CAPSULE BEFORE BREAKFAST DAILY   QVAR 80 MCG/ACT inhaler Generic drug:  beclomethasone INHALE TWO PUFFS TWICE DAILY TO PREVENT COUGH OR WHEEZE   ranitidine 300 MG tablet Commonly known as:  ZANTAC TAKE ONE TABLET EACH EVENING DAILY   VITAMIN D PO Take by mouth daily.       Past Medical History:  Diagnosis Date  . Allergic rhinoconjunctivitis   . Asthma   . Atrial fibrillation (HCC)   . GERD (gastroesophageal reflux disease)   . High blood pressure     Past Surgical History:  Procedure Laterality Date  . CHOLECYSTECTOMY    . TONSILLECTOMY      Review of systems negative except as noted in HPI / PMHx or noted below:  Review of Systems  Constitutional: Negative.   HENT: Negative.   Eyes: Negative.   Respiratory: Negative.   Cardiovascular: Negative.   Gastrointestinal: Negative.   Genitourinary: Negative.  Musculoskeletal: Negative.   Skin: Negative.   Neurological: Negative.   Endo/Heme/Allergies: Negative.   Psychiatric/Behavioral: Negative.      Objective:   Vitals:   06/15/16 1042  BP: 122/72  Pulse: 76  Resp: 16          Physical Exam  Constitutional: He is well-developed, well-nourished, and in no distress.  HENT:  Head: Normocephalic.  Right Ear: Tympanic membrane, external ear and ear canal normal.  Left Ear: Tympanic membrane, external ear and ear canal normal.  Nose: Mucosal edema present. No rhinorrhea.  Mouth/Throat: Uvula is midline, oropharynx is clear and moist and mucous membranes are normal. No  oropharyngeal exudate.  Eyes: Conjunctivae are normal.  Neck: Trachea normal. No tracheal tenderness present. No tracheal deviation present. No thyromegaly present.  Cardiovascular: Normal rate, regular rhythm, S1 normal, S2 normal and normal heart sounds.   No murmur heard. Pulmonary/Chest: Breath sounds normal. No stridor. No respiratory distress. He has no wheezes. He has no rales.  Musculoskeletal: He exhibits no edema.  Lymphadenopathy:       Head (right side): No tonsillar adenopathy present.       Head (left side): No tonsillar adenopathy present.    He has no cervical adenopathy.  Neurological: He is alert. Gait normal.  Skin: No rash noted. He is not diaphoretic. No erythema. Nails show no clubbing.  Psychiatric: Mood and affect normal.    Diagnostics:    Spirometry was performed and demonstrated an FEV1 of 2.45 at 88 % of predicted.  The patient had an Asthma Control Test with the following results: ACT Total Score: 23.    Assessment and Plan:   1. Moderate persistent asthma without complication   2. Other allergic rhinitis   3. LPRD (laryngopharyngeal reflux disease)   4. Acute non-recurrent ethmoidal sinusitis     1. Continue reflux therapy:   A. omeprazole to 40 mg one time per day in a.m.  B. ranitidine 300 mg one time per day in p.m.  2. Continue Flonase one spray each nostril twice a day plus astelin two sprays each nostril two times per day  3. Continue Qvar 80 2 inhalations twice a day with spacer  4. Continue montelukast 10 mg daily  5. Continue Proventil HFA if needed  6. Continue Zyrtec one tablet one time per day if needed  7. Continue nasal saline multiple times a day if needed  8. Continue Mucinex DM 2 tablets twice a day if needed  9. For this recent episode of sinusitis use the following:   A. Augmentin 875 one tablet twice a day for 10 days  B. prednisone 10 mg one tablet once a day for 5 days  10. Return in 3 months or earlier if  problem  I will assume that Bob Newman will do well as he continues to use anti-inflammatory medications for his airway and therapy directed against reflux in conjunction with an acute treatment plan directed against his episode of sinusitis as noted above. He will keep in contact with me noting his response as he moves forward. I will see him back in this clinic in 3 months or earlier if there is a problem.  Laurette Schimke, MD [facility]

## 2016-06-15 NOTE — Patient Instructions (Signed)
  1. Continue reflux therapy:   A. omeprazole to 40 mg one time per day in a.m.  B. ranitidine 300 mg one time per day in p.m.  2. Continue Flonase one spray each nostril twice a day plus astelin two sprays each nostril two times per day  3. Continue Qvar 80 2 inhalations twice a day with spacer  4. Continue montelukast 10 mg daily  5. Continue Proventil HFA if needed  6. Continue Zyrtec one tablet one time per day if needed  7. Continue nasal saline multiple times a day if needed  8. Continue Mucinex DM 2 tablets twice a day if needed  9. For this recent episode of sinusitis use the following:   A. Augmentin 875 one tablet twice a day for 10 days  B. prednisone 10 mg one tablet once a day for 5 days  10. Return in 3 months or earlier if problem

## 2016-08-17 ENCOUNTER — Telehealth: Payer: Self-pay | Admitting: *Deleted

## 2016-08-17 NOTE — Telephone Encounter (Signed)
Called patient and advised per Dr Lucie LeatherKozlow labs he dropped off drawn 08/11/16 show creatinine 1.5 normal. Labs okay

## 2016-09-14 ENCOUNTER — Ambulatory Visit: Payer: Medicare PPO | Admitting: Allergy and Immunology

## 2016-09-22 HISTORY — PX: THROMBOENDARTERECTOMY: SHX46

## 2016-11-11 ENCOUNTER — Telehealth: Payer: Self-pay | Admitting: Allergy

## 2016-11-11 NOTE — Telephone Encounter (Signed)
Bob MessierKathy Newman called and said he got a no show charge and he said he had canceled it. He is in [place]. Will be there for about a hour. Phone number there is [phone]. Thanks.

## 2016-11-12 NOTE — Telephone Encounter (Signed)
I called Bob Newman this morning and left a message at [phone]. I informed Bob Newman that I have removed the no show fee.  After I seen [name]'s note I also called the [phone]. I left a message and informed him that I have removed the no show fee.

## 2016-11-12 NOTE — Telephone Encounter (Signed)
Patient called today asking why he has not received a phone call about a no-show charge on 09/07/16 for $25.  He said he called and canceled this appointment in advance.  Please call patient at [phone] or [phone] (mobile).

## 2017-02-24 DIAGNOSIS — E785 Hyperlipidemia, unspecified: Secondary | ICD-10-CM | POA: Insufficient documentation

## 2017-02-24 DIAGNOSIS — I6529 Occlusion and stenosis of unspecified carotid artery: Secondary | ICD-10-CM | POA: Insufficient documentation

## 2017-02-24 DIAGNOSIS — Z7901 Long term (current) use of anticoagulants: Secondary | ICD-10-CM | POA: Insufficient documentation

## 2017-02-24 DIAGNOSIS — I2510 Atherosclerotic heart disease of native coronary artery without angina pectoris: Secondary | ICD-10-CM | POA: Insufficient documentation

## 2017-05-27 ENCOUNTER — Ambulatory Visit: Payer: Medicare PPO | Admitting: Allergy and Immunology

## 2017-05-27 ENCOUNTER — Encounter: Payer: Self-pay | Admitting: Allergy and Immunology

## 2017-05-27 VITALS — BP 136/68 | HR 64 | Resp 16

## 2017-05-27 DIAGNOSIS — J3089 Other allergic rhinitis: Secondary | ICD-10-CM

## 2017-05-27 DIAGNOSIS — K219 Gastro-esophageal reflux disease without esophagitis: Secondary | ICD-10-CM

## 2017-05-27 DIAGNOSIS — J4540 Moderate persistent asthma, uncomplicated: Secondary | ICD-10-CM | POA: Diagnosis not present

## 2017-05-27 MED ORDER — AZELASTINE HCL 0.1 % NA SOLN: 2.0000 | Freq: Two times a day (BID) | NASAL | 5 refills | Status: DC

## 2017-05-27 MED ORDER — MONTELUKAST SODIUM 10 MG PO TABS: ORAL_TABLET | ORAL | 5 refills | Status: DC

## 2017-05-27 MED ORDER — RANITIDINE HCL 300 MG PO TABS: ORAL_TABLET | ORAL | 5 refills | Status: DC

## 2017-05-27 MED ORDER — FLUTICASONE PROPIONATE 50 MCG/ACT NA SUSP: 2.0000 | Freq: Two times a day (BID) | NASAL | 5 refills | Status: DC

## 2017-05-27 MED ORDER — BECLOMETHASONE DIPROP HFA 80 MCG/ACT IN AERB: 2.0000 | INHALATION_SPRAY | Freq: Two times a day (BID) | RESPIRATORY_TRACT | 5 refills | Status: DC

## 2017-05-27 MED ORDER — OMEPRAZOLE 40 MG PO CPDR
DELAYED_RELEASE_CAPSULE | ORAL | 5 refills | Status: DC
Start: 2017-05-27 — End: 2018-05-23

## 2017-05-27 NOTE — Patient Instructions (Addendum)
  1. Continue reflux therapy:   A. omeprazole to 40 mg one time per day in a.m.  B. ranitidine 300 mg one time per day in p.m.  2. Continue anti-inflammatory therapy:    A. Flonase plus Astelin two times per day  B. Qvar 80 2 inhalations two times per day during increased asthma activity  C. montelukast 10 mg daily  3. If Needed:   A. Proventil HFA if needed  B. Zyrtec one tablet one time per day   C. nasal saline multiple times a day   D. Mucinex DM 2 tablets twice a day   4. Return in 6 months or earlier if problem

## 2017-05-27 NOTE — Progress Notes (Signed)
Follow-up Note  Referring Provider: No ref. provider found Primary Provider: System, Pcp Not In Date of Office Visit: 05/27/2017  Subjective:   Bob Newman (DOB: May 14, 1946) is a 71 y.o. male who [facility]2017 in re-evaluation of the following:  HPI: Bob Newman returns to this clinic in reevaluation of his asthma and allergic rhinitis and LPR.  I have not seen him in this clinic since January 2018.  He has really done very well this year.  Ever since he had a carotid endarterectomy performed this spring he has basically been without any significant respiratory tract symptoms and has not required an antibiotic or a systemic steroid to treat any type of respiratory tract issue.  It is only over the course of the past week or so that he has developed a little bit of nasal congestion and mucus production but still feels relatively well even with this issue that has developed the past week or so.  He has not been having any issues with reflux.  He is very careful about caffeine and chocolate consumption.  He does not use his Qvar on a regular basis but does consistently treat his reflux with a proton pump inhibitor and an H2 receptor blocker and does use Flonase and Astelin consistently as well as a leukotriene modifier.  He did have the flu vaccine administered.            Allergies as of 05/27/2017      Reactions   Latex Shortness Of Breath   Levaquin [levofloxacin] Hives   Miralax [polyethylene Glycol] Hives   Spiriva Handihaler [tiotropium Bromide Monohydrate] Hives      Medication List      amLODipine 5 MG tablet Commonly known as:  NORVASC 5 mg daily.   azelastine 0.1 % nasal spray Commonly known as:  ASTELIN Place 2 sprays into both nostrils 2 (two) times daily.   beclomethasone 80 MCG/ACT inhaler Commonly known as:  QVAR REDIHALER Inhale 2 puffs into the lungs 2 (two) times daily. Rinse, gargle, and spit after use.   candesartan  32 MG tablet Commonly known as:  ATACAND Take 32 mg by mouth daily.   CRESTOR 20 MG tablet Generic drug:  rosuvastatin Take 20 mg by mouth daily.   dronedarone 400 MG tablet Commonly known as:  MULTAQ Take 400 mg by mouth 2 (two) times daily with a meal.   ELIQUIS 5 MG Tabs tablet Generic drug:  apixaban   fluticasone 50 MCG/ACT nasal spray Commonly known as:  FLONASE Place 2 sprays into both nostrils 2 (two) times daily.   IRON PO Take by mouth.   metoprolol succinate 50 MG 24 hr tablet Commonly known as:  TOPROL-XL   montelukast 10 MG tablet Commonly known as:  SINGULAIR TAKE ONE TABLET ONCE DAILY   nitroGLYCERIN 0.4 MG SL tablet Commonly known as:  NITROSTAT   omeprazole 40 MG capsule Commonly known as:  PRILOSEC TAKE ONE CAPSULE BEFORE BREAKFAST DAILY   ranitidine 300 MG tablet Commonly known as:  ZANTAC Take one tablet by mouth every evening.   VITAMIN D PO Take by mouth daily.       Past Medical History:  Diagnosis Date  . Allergic rhinoconjunctivitis   . Asthma   . Atrial fibrillation (HCC)   . GERD (gastroesophageal reflux disease)   . High blood pressure     Past Surgical History:  Procedure Laterality Date  . CHOLECYSTECTOMY    . THROMBOENDARTERECTOMY  Right 09/22/2016   [facility]ath[facility]  . TONSILLECTOMY      Review of systems negative except as noted in HPI / PMHx or noted below:  Review of Systems  Constitutional: Negative.   HENT: Negative.   Eyes: Negative.   Respiratory: Negative.   Cardiovascular: Negative.   Gastrointestinal: Negative.   Genitourinary: Negative.   Musculoskeletal: Negative.   Skin: Negative.   Neurological: Negative.   Endo/Heme/Allergies: Negative.   Psychiatric/Behavioral: Negative.      Objective:   Vitals:   05/27/17 1131  BP: 136/68  Pulse: 64  Resp: 16          Physical Exam  Constitutional: He is well-developed, well-nourished, and in no distress.  HENT:  Head:  Normocephalic.  Right Ear: Tympanic membrane, external ear and ear canal normal.  Left Ear: Tympanic membrane, external ear and ear canal normal.  Nose: Nose normal. No mucosal edema or rhinorrhea.  Mouth/Throat: Uvula is midline, oropharynx is clear and moist and mucous membranes are normal. No oropharyngeal exudate.  Eyes: Conjunctivae are normal.  Neck: Trachea normal. No tracheal tenderness present. No tracheal deviation present. No thyromegaly present.  Cardiovascular: Normal rate, regular rhythm, S1 normal, S2 normal and normal heart sounds.  No murmur heard. Pulmonary/Chest: Breath sounds normal. No stridor. No respiratory distress. He has no wheezes. He has no rales.  Musculoskeletal: He exhibits no edema.  Lymphadenopathy:       Head (right side): No tonsillar adenopathy present.       Head (left side): No tonsillar adenopathy present.    He has no cervical adenopathy.  Neurological: He is alert. Gait normal.  Skin: No rash noted. He is not diaphoretic. No erythema. Nails show no clubbing.  Psychiatric: Mood and affect normal.    Diagnostics:    Spirometry was performed and demonstrated an FEV1 of 2.61 at 93 % of predicted.  The patient had an Asthma Control Test with the following results: ACT Total Score: 18.    Assessment and Plan:   1. Moderate persistent asthma without complication   2. Other allergic rhinitis   3. LPRD (laryngopharyngeal reflux disease)     1. Continue reflux therapy:   A. omeprazole to 40 mg one time per day in a.m.  B. ranitidine 300 mg one time per day in p.m.  2. Continue anti-inflammatory therapy:    A. Flonase plus Astelin two times per day  B. Restart Qvar 80 2 inhalations two times per day during increased asthma activity  C. montelukast 10 mg daily  3. If Needed:   A. Proventil HFA if needed  B. Zyrtec one tablet one time per day   C. nasal saline multiple times a day   D. Mucinex DM 2 tablets twice a day   4. Return in 6  months or earlier if problem  Bob Newman appears to be doing very well at this point in time and he will continue to utilize anti-inflammatory agents for his respiratory tract and continue on therapy directed against reflux.  I will now see him back in this clinic in approximately 6 months or earlier if there is a problem.  Laurette SchimkeEric Kozlow, MD Allergy / Immunology [facility]

## 2017-05-28 ENCOUNTER — Encounter: Payer: Self-pay | Admitting: Allergy and Immunology

## 2017-06-04 ENCOUNTER — Other Ambulatory Visit: Payer: Self-pay

## 2017-06-04 ENCOUNTER — Telehealth: Payer: Self-pay | Admitting: Family Medicine

## 2017-06-04 ENCOUNTER — Telehealth: Payer: Self-pay | Admitting: Allergy and Immunology

## 2017-06-04 MED ORDER — PREDNISONE 10 MG PO TABS: 20.0000 mg | ORAL_TABLET | Freq: Every day | ORAL | 0 refills | Status: DC

## 2017-06-04 MED ORDER — DOXYCYCLINE HYCLATE 100 MG PO TABS: 100.0000 mg | ORAL_TABLET | Freq: Two times a day (BID) | ORAL | 0 refills | Status: DC

## 2017-06-04 MED ORDER — DOXYCYCLINE HYCLATE 100 MG IV SOLR: 100.0000 mg | Freq: Two times a day (BID) | INTRAVENOUS | 0 refills | Status: DC

## 2017-06-04 MED ORDER — AZITHROMYCIN 500 MG PO TABS: 500.0000 mg | ORAL_TABLET | Freq: Every day | ORAL | 0 refills | Status: DC

## 2017-06-04 NOTE — Telephone Encounter (Signed)
Pt complain of stuffy nose, with some chest pain in his lung area. He believes it is bronchitits. He does have some rawness burning feeling in upper chest. Using qvar. Some shortness of breath. Started x 5 days ago.  Please advise

## 2017-06-04 NOTE — Telephone Encounter (Signed)
PT CALLED IN STATING THAT THE AZITHROMYCIN WILL CAUSE AN IRREGULAR HEARTBEAT DUE TO ANOTHER MEDICINE HE IS CURRENTLY TAKING and would like for you to switch it to something else please.

## 2017-06-04 NOTE — Telephone Encounter (Signed)
Patient feels he has a sinus infection Wants an antibiotic called into the [facility] on [address] ( [facility] ) Pat claims to have just been seen and felt fine, but has picked something up over the holiday.  Patient does not want to come out in the weather if possible Please call patient with any questions

## 2017-06-04 NOTE — Telephone Encounter (Signed)
Sent in rx and informed pt 

## 2017-06-04 NOTE — Telephone Encounter (Signed)
Sent in rx, and informed pt of the change

## 2017-06-04 NOTE — Telephone Encounter (Signed)
Can you please order Azithromycin 500 mg a day for 3 days and prednisone 20 mg a day for three days. Thank you

## 2017-06-04 NOTE — Telephone Encounter (Signed)
I have changed this medication from Azithromycin to Doxycycline 100 mg BID for 10 days. Can you please call him and let him know? Thank you

## 2017-06-06 NOTE — Telephone Encounter (Signed)
Medicine changed from Azithromycin to doxycycline.

## 2017-11-09 ENCOUNTER — Ambulatory Visit: Payer: Medicare PPO | Admitting: Allergy

## 2017-11-09 ENCOUNTER — Encounter: Payer: Self-pay | Admitting: Allergy

## 2017-11-09 VITALS — BP 130/62 | HR 72 | Resp 18

## 2017-11-09 DIAGNOSIS — J4541 Moderate persistent asthma with (acute) exacerbation: Secondary | ICD-10-CM | POA: Diagnosis not present

## 2017-11-09 DIAGNOSIS — K219 Gastro-esophageal reflux disease without esophagitis: Secondary | ICD-10-CM

## 2017-11-09 DIAGNOSIS — J3089 Other allergic rhinitis: Secondary | ICD-10-CM | POA: Diagnosis not present

## 2017-11-09 DIAGNOSIS — J0100 Acute maxillary sinusitis, unspecified: Secondary | ICD-10-CM | POA: Diagnosis not present

## 2017-11-09 DIAGNOSIS — J4540 Moderate persistent asthma, uncomplicated: Secondary | ICD-10-CM | POA: Diagnosis not present

## 2017-11-09 MED ORDER — LEVALBUTEROL TARTRATE 45 MCG/ACT IN AERO: INHALATION_SPRAY | RESPIRATORY_TRACT | 1 refills | Status: DC

## 2017-11-09 MED ORDER — AMOXICILLIN-POT CLAVULANATE 875-125 MG PO TABS: ORAL_TABLET | ORAL | 0 refills | Status: DC

## 2017-11-09 NOTE — Progress Notes (Signed)
Follow-up Note  RE: Bob Newman MRN: [id number] DOB: November 23, 1945 Date of Office Visit: 11/09/2017   History of present illness: Bob Newman is a 72 y.o. male presenting today for sick visit.  He has a history of asthma, allergic rhinitis and reflux.  He was last seen in the office by Dr. Lucie Leather December 2018.  He states he has been doing well up until recently got a new lawnmower for his yard.  He states previously his wife was cutting the grass and she enjoyed this but she does not know how to use this new lawnmower.  Thus he has been cutting the grass lately.  He states this past weekend he was helping his son with yard work and was cutting down a tree and then yesterday he was mowing the lawn and between the combination of these 2 activities he states he is now sick.  Starting on Saturday he has been having thick green mucus that he is coughing up and/or blowing out as well as pain in his cheeks and ears and difficulty breathing.  He states last night he woke up in a cold sweat and felt like he could not breathe.  He says it took about 10 to 15 minutes to recover.  He states he does not have an albuterol inhaler as he states he was told he could not use it due to his A. fib history.  Thus he states he uses Qvar.  He is now on Qvar 40 which was stepped down by his PCP about 4 to 5 months ago from Qvar 80.  He states he is not sure why this change was made as he was doing well on the Qvar 80. He states he cannot wear a mask to do the yard work as in the past when he has worn a mask states his symptoms have been worse.  He does report that if he ties a T-shirt around his face that has been somewhat helpful in preventing him from getting sick. He does continue on omeprazole and ranitidine as well as Flonase and Astelin.  He also still takes daily Singulair but is not sure if this is providing any relief.  He does not take any antihistamines as he says that they have never been that helpful.  He  does state he uses saline spray and on occasion will do saline rinses. He states he started back mowing the lawn    Review of systems: Review of Systems  Constitutional: Positive for chills. Negative for fever, malaise/fatigue and weight loss.  HENT: Positive for congestion, ear pain and sinus pain. Negative for ear discharge, hearing loss, nosebleeds, sore throat and tinnitus.   Eyes: Negative for pain, discharge and redness.  Respiratory: Positive for cough, sputum production and shortness of breath.   Cardiovascular: Negative for chest pain.  Gastrointestinal: Negative for abdominal pain, constipation, diarrhea, heartburn, nausea and vomiting.  Musculoskeletal: Negative for joint pain.  Skin: Negative for itching and rash.  Neurological: Negative for headaches.    All other systems negative unless noted above in HPI  Past medical/social/surgical/family history have been reviewed and are unchanged unless specifically indicated below.  No changes  Medication List: Allergies as of 11/09/2017      Reactions   Latex Shortness Of Breath   Levaquin [levofloxacin] Hives   Miralax [polyethylene Glycol] Hives   Spiriva Handihaler [tiotropium Bromide Monohydrate] Hives      Medication List        Accurate as of 11/09/17  11:55 AM. Always use your most recent med list.          amLODipine 5 MG tablet Commonly known as:  NORVASC 5 mg daily.   amoxicillin-clavulanate 875-125 MG tablet Commonly known as:  AUGMENTIN Take one tablet twice daily for 10 days   azelastine 0.1 % nasal spray Commonly known as:  ASTELIN Place 2 sprays into both nostrils 2 (two) times daily.   beclomethasone 80 MCG/ACT inhaler Commonly known as:  QVAR REDIHALER Inhale 2 puffs into the lungs 2 (two) times daily. Rinse, gargle, and spit after use.   candesartan 32 MG tablet Commonly known as:  ATACAND Take 32 mg by mouth daily.   CRESTOR 20 MG tablet Generic drug:  rosuvastatin Take 20 mg by mouth  daily.   dronedarone 400 MG tablet Commonly known as:  MULTAQ Take 400 mg by mouth 2 (two) times daily with a meal.   ELIQUIS 5 MG Tabs tablet Generic drug:  apixaban   fluticasone 50 MCG/ACT nasal spray Commonly known as:  FLONASE Place 2 sprays into both nostrils 2 (two) times daily.   IRON PO Take by mouth.   levalbuterol 45 MCG/ACT inhaler Commonly known as:  XOPENEX HFA Inhale two puffs every 4-6 hours if needed for cough/wheeze/shortness of breath.   metoprolol succinate 50 MG 24 hr tablet Commonly known as:  TOPROL-XL   montelukast 10 MG tablet Commonly known as:  SINGULAIR TAKE ONE TABLET ONCE DAILY   nitroGLYCERIN 0.4 MG SL tablet Commonly known as:  NITROSTAT   omeprazole 40 MG capsule Commonly known as:  PRILOSEC TAKE ONE CAPSULE BEFORE BREAKFAST DAILY   ranitidine 300 MG tablet Commonly known as:  ZANTAC Take one tablet by mouth every evening.   VITAMIN D PO Take by mouth daily.       Known medication allergies: Allergies  Allergen Reactions  . Latex Shortness Of Breath  . Levaquin [Levofloxacin] Hives  . Miralax [Polyethylene Glycol] Hives  . Spiriva Handihaler [Tiotropium Bromide Monohydrate] Hives     Physical examination: Blood pressure 130/62, pulse 72, resp. rate 18.  General: Alert, interactive, in no acute distress. HEENT: PERRLA, TMs pearly gray, turbinates markedly edematous with clear discharge, post-pharynx non erythematous. Neck: Supple without lymphadenopathy. Lungs: Clear to auscultation without wheezing, rhonchi or rales. {no increased work of breathing. CV: Normal S1, S2 without murmurs. Abdomen: Nondistended, nontender. Skin: Warm and dry, without lesions or rashes. Extremities:  No clubbing, cyanosis or edema. Neuro:   Grossly intact.  Diagnositics/Labs: Spirometry: FEV1: 2.22L 82%, FVC: 3.22L 85%, ratio consistent with nonobstructive pattern  Assessment and plan:   Acute maxillary sinusitis -likely triggered by  recent allergen exposure.  He is having significant nasal congestion with sinus pain and pressure.  Will treat as below with Augmentin course. Moderate persistent asthma with acute exacerbation -likely triggered by recent allergen exposure.  Provided with a quick prednisone burst.  I have changed his Proventil to Xopenex due to his cardiac history.  I have also asked that he increase his Qvar during this exacerbation. Allergic rhinitis -continue current management with nasal sprays and saline rinse LPRD -continue current regimen  1. Continue reflux therapy:   A. omeprazole to 40 mg one time per day in a.m.  B. ranitidine 300 mg one time per day in p.m.  2. Continue anti-inflammatory therapy:    A. Flonase plus Astelin two times per day  B. Qvar 40 2 inhalations two times per day    C. montelukast 10 mg daily  3. For this recent episode of sinusitis with asthma exacerbation use the following:              A. Augmentin 875 one tablet twice a day for 10 days             B. prednisone 20 mg one tablet once a day for 5 days    C.  I have also asked whule he is symptomatic to increase his Qvar to 4 puffs twice a day to get to the equivalent of the Qvar 80 mcg.  Once he has recovered he can go back down to his regular 2 puffs twice a day   4. If Needed:   A. Xopenex HFA if needed  C. nasal saline multiple times a day   D. Mucinex 1200mg  (2 tablets) twice a day   5. Return in 4-6 months or earlier if problem   I appreciate the opportunity to take part in Lash's care. Please do not hesitate to contact me with questions.  Sincerely,   Margo Aye, MD Allergy/Immunology [facility]

## 2017-11-09 NOTE — Patient Instructions (Addendum)
  1. Continue reflux therapy:   A. omeprazole to 40 mg one time per day in a.m.  B. ranitidine 300 mg one time per day in p.m.  2. Continue anti-inflammatory therapy:    A. Flonase plus Astelin two times per day  B. Qvar 40 2 inhalations two times per day  C. montelukast 10 mg daily  3. For this recent episode of sinusitis use the following:              A. Augmentin 875 one tablet twice a day for 10 days             B. prednisone 20 mg one tablet once a day for 5 days   4. If Needed:   A. Xopenex HFA if needed  C. nasal saline multiple times a day   D. Mucinex 1200mg  (2 tablets) twice a day   4. Return in 4-6 months or earlier if problem

## 2017-11-22 ENCOUNTER — Ambulatory Visit: Payer: Medicare PPO | Admitting: Allergy and Immunology

## 2017-11-22 ENCOUNTER — Encounter: Payer: Self-pay | Admitting: Allergy and Immunology

## 2017-11-22 VITALS — BP 122/68 | HR 68 | Temp 98.5°F | Resp 16

## 2017-11-22 DIAGNOSIS — J3089 Other allergic rhinitis: Secondary | ICD-10-CM | POA: Diagnosis not present

## 2017-11-22 DIAGNOSIS — K219 Gastro-esophageal reflux disease without esophagitis: Secondary | ICD-10-CM

## 2017-11-22 DIAGNOSIS — J4540 Moderate persistent asthma, uncomplicated: Secondary | ICD-10-CM

## 2017-11-22 MED ORDER — METHYLPREDNISOLONE ACETATE 80 MG/ML IJ SUSP
80.0000 mg | Freq: Once | INTRAMUSCULAR | Status: AC
  Administered 2017-11-22: 80 mg via INTRAMUSCULAR

## 2017-11-22 NOTE — Progress Notes (Signed)
Follow-up Note  Referring Provider: No ref. provider found Primary Provider: System, Pcp Not In Date of Office Visit: 11/22/2017  Subjective:   Bob Newman (DOB: 01-22-1946) is a 72 y.o. male who [facility]2017 in re-evaluation of the following:  HPI: Bob Newman presents to this clinic in reevaluation of his asthma and allergic rhinitis and reflux induced respiratory disease.  His last visit with me in this clinic was 27 May 2017 and he visited with Dr. Delorse Lek on 09 November 2017 for a infectious disease induced flare of his respiratory tract disease where he was treated with broad-spectrum antibiotics and systemic steroids.  He is much better at this point in time with resolution of his ugly discharge from his head and chest but he still feels very raw in his chest and rawness in his throat and has nasal congestion.  He does not have any fever and he is not making any ugly sputum production at this point in time.  He has not been using his rescue inhaler.  It should be noted that his wife is home with a similar type of presentation around the same point in time.  Prior to this event he did wonderful without the use of an antibiotic for almost a year.  Although he did have some persistent nasal congestion and some occasional cough he rarely had any significant respiratory tract symptoms requiring him to use a short acting bronchodilator.  In fact, he has run out of his prescription for short acting bronchodilator.  He thinks that his reflux is under good control on his current plan of therapy.  Allergies as of 11/22/2017      Reactions   Latex Shortness Of Breath   Levaquin [levofloxacin] Hives   Miralax [polyethylene Glycol] Hives   Spiriva Handihaler [tiotropium Bromide Monohydrate] Hives      Medication List      amLODipine 5 MG tablet Commonly known as:  NORVASC 5 mg daily.   azelastine 0.1 % nasal spray Commonly known as:   ASTELIN Place 2 sprays into both nostrils 2 (two) times daily.   beclomethasone 80 MCG/ACT inhaler Commonly known as:  QVAR REDIHALER Inhale 2 puffs into the lungs 2 (two) times daily. Rinse, gargle, and spit after use.   candesartan 32 MG tablet Commonly known as:  ATACAND Take 32 mg by mouth daily.   CRESTOR 20 MG tablet Generic drug:  rosuvastatin Take 20 mg by mouth daily.   dronedarone 400 MG tablet Commonly known as:  MULTAQ Take 400 mg by mouth 2 (two) times daily with a meal.   ELIQUIS 5 MG Tabs tablet Generic drug:  apixaban   fluticasone 50 MCG/ACT nasal spray Commonly known as:  FLONASE Place 2 sprays into both nostrils 2 (two) times daily.   IRON PO Take by mouth.   levalbuterol 45 MCG/ACT inhaler Commonly known as:  XOPENEX HFA Inhale two puffs every 4-6 hours if needed for cough/wheeze/shortness of breath.   metoprolol succinate 50 MG 24 hr tablet Commonly known as:  TOPROL-XL   montelukast 10 MG tablet Commonly known as:  SINGULAIR TAKE ONE TABLET ONCE DAILY   nitroGLYCERIN 0.4 MG SL tablet Commonly known as:  NITROSTAT   omeprazole 40 MG capsule Commonly known as:  PRILOSEC TAKE ONE CAPSULE BEFORE BREAKFAST DAILY   ranitidine 300 MG tablet Commonly known as:  ZANTAC Take one tablet by mouth every evening.   VITAMIN D PO Take by mouth daily.  Past Medical History:  Diagnosis Date  . Allergic rhinoconjunctivitis   . Asthma   . Atrial fibrillation (HCC)   . GERD (gastroesophageal reflux disease)   . High blood pressure     Past Surgical History:  Procedure Laterality Date  . CHOLECYSTECTOMY    . THROMBOENDARTERECTOMY Right 09/22/2016   Boykin Reaper[facility] at [facility]  . TONSILLECTOMY      Review of systems negative except as noted in HPI / PMHx or noted below:  Review of Systems  Constitutional: Negative.   HENT: Negative.   Eyes: Negative.   Respiratory: Negative.   Cardiovascular: Negative.    Gastrointestinal: Negative.   Genitourinary: Negative.   Musculoskeletal: Negative.   Skin: Negative.   Neurological: Negative.   Endo/Heme/Allergies: Negative.   Psychiatric/Behavioral: Negative.      Objective:   Vitals:   11/22/17 1113  BP: 122/68  Pulse: 68  Resp: 16  Temp: 98.5 F (36.9 C)          Physical Exam  HENT:  Head: Normocephalic.  Right Ear: Tympanic membrane, external ear and ear canal normal.  Left Ear: Tympanic membrane, external ear and ear canal normal.  Nose: Mucosal edema present. No rhinorrhea.  Mouth/Throat: Uvula is midline, oropharynx is clear and moist and mucous membranes are normal. No oropharyngeal exudate.  Eyes: Conjunctivae are normal.  Neck: Trachea normal. No tracheal tenderness present. No tracheal deviation present. No thyromegaly present.  Cardiovascular: Normal rate, regular rhythm, S1 normal, S2 normal and normal heart sounds.  No murmur heard. Pulmonary/Chest: Breath sounds normal. No stridor. No respiratory distress. He has no wheezes. He has no rales.  Musculoskeletal: He exhibits no edema.  Lymphadenopathy:       Head (right side): No tonsillar adenopathy present.       Head (left side): No tonsillar adenopathy present.    He has no cervical adenopathy.  Neurological: He is alert.  Skin: No rash noted. He is not diaphoretic. No erythema. Nails show no clubbing.    Diagnostics:    Spirometry was performed and demonstrated an FEV1 of 2.40 at 88 % of predicted.  The patient had an Asthma Control Test with the following results: ACT Total Score: 18.    Assessment and Plan:   1. Not well controlled moderate persistent asthma   2. Other allergic rhinitis   3. LPRD (laryngopharyngeal reflux disease)     1. Continue reflux therapy:   A. omeprazole 40 mg one time per day in a.m.  B. ranitidine 300 mg one time per day in p.m.  2. Continue anti-inflammatory therapy:    A. Flonase plus Astelin two times per day  B.  Qvar 80 2 inhalations two times per day    C. montelukast 10 mg daily  D. DepoMedrol 80 IM delivered in clinic today  3. If Needed:   A. Proventil HFA or Xopenex HFA if needed  B. Zyrtec one tablet one time per day   C. nasal saline multiple times a day   D. Mucinex DM 2 tablets twice a day   4. Return in 6 months or earlier if problem  Dia appears to have persistent inflammation of his airway.  I suspect that this was secondary to a viral respiratory tract infection especially given the fact that his wife is at home with a similar type of issue.  We will treat him with a systemic steroid with the use of Depo-Medrol today and he will continue to use a large collection  of medical therapy directed against respiratory tract inflammation and reflux as noted above.  Assuming he does well I will see him back in this clinic in 6 months or earlier if there is a problem.  Laurette SchimkeEric Jamarien Rodkey, MD Allergy / Immunology [facility]

## 2017-11-22 NOTE — Patient Instructions (Addendum)
  1. Continue reflux therapy:   A. omeprazole 40 mg one time per day in a.m.  B. ranitidine 300 mg one time per day in p.m.  2. Continue anti-inflammatory therapy:    A. Flonase plus Astelin two times per day  B. Qvar 80 2 inhalations two times per day    C. montelukast 10 mg daily  D. DepoMedrol 80 IM delivered in clinic today  3. If Needed:   A. Proventil HFA or Xopenex HFA if needed  B. Zyrtec one tablet one time per day   C. nasal saline multiple times a day   D. Mucinex DM 2 tablets twice a day   4. Return in 6 months or earlier if problem

## 2017-11-23 ENCOUNTER — Encounter: Payer: Self-pay | Admitting: Allergy and Immunology

## 2018-05-23 ENCOUNTER — Encounter: Payer: Self-pay | Admitting: Allergy and Immunology

## 2018-05-23 ENCOUNTER — Ambulatory Visit: Payer: Medicare PPO | Admitting: Allergy and Immunology

## 2018-05-23 VITALS — BP 140/84 | HR 63 | Resp 18 | Ht 68.0 in

## 2018-05-23 DIAGNOSIS — K219 Gastro-esophageal reflux disease without esophagitis: Secondary | ICD-10-CM

## 2018-05-23 DIAGNOSIS — J3089 Other allergic rhinitis: Secondary | ICD-10-CM

## 2018-05-23 DIAGNOSIS — J4541 Moderate persistent asthma with (acute) exacerbation: Secondary | ICD-10-CM

## 2018-05-23 DIAGNOSIS — J4540 Moderate persistent asthma, uncomplicated: Secondary | ICD-10-CM

## 2018-05-23 MED ORDER — MONTELUKAST SODIUM 10 MG PO TABS: ORAL_TABLET | ORAL | 5 refills | Status: DC

## 2018-05-23 MED ORDER — LEVALBUTEROL TARTRATE 45 MCG/ACT IN AERO: INHALATION_SPRAY | RESPIRATORY_TRACT | 1 refills | Status: DC

## 2018-05-23 MED ORDER — OMEPRAZOLE 40 MG PO CPDR: DELAYED_RELEASE_CAPSULE | ORAL | 5 refills | Status: DC

## 2018-05-23 MED ORDER — FLUTICASONE PROPIONATE 50 MCG/ACT NA SUSP
2.0000 | Freq: Two times a day (BID) | NASAL | 5 refills | Status: DC
Start: 2018-05-23 — End: 2019-05-25

## 2018-05-23 MED ORDER — BECLOMETHASONE DIPROP HFA 80 MCG/ACT IN AERB: 2.0000 | INHALATION_SPRAY | Freq: Two times a day (BID) | RESPIRATORY_TRACT | 5 refills | Status: DC

## 2018-05-23 MED ORDER — RANITIDINE HCL 300 MG PO TABS: ORAL_TABLET | ORAL | 5 refills | Status: DC

## 2018-05-23 MED ORDER — AZELASTINE HCL 0.1 % NA SOLN: 2.0000 | Freq: Two times a day (BID) | NASAL | 5 refills | Status: DC

## 2018-05-23 NOTE — Patient Instructions (Signed)
  1. Continue reflux therapy:   A. omeprazole 40 mg one time per day in a.m.  B. ranitidine 300 mg one time per day in p.m.  2. Continue anti-inflammatory therapy:    A. Flonase plus Astelin two times per day  B. Qvar 80 2 inhalations two times per day    C. montelukast 10 mg daily  3. If Needed:   A. Proventil HFA or Xopenex HFA if needed  B. Zyrtec one tablet one time per day   C. nasal saline multiple times a day   D. Mucinex DM 2 tablets twice a day   4. Return in 6 months or earlier if problem

## 2018-05-23 NOTE — Progress Notes (Signed)
Follow-up Note  Referring Provider: Lamont DowdyBryson, Cathy Primary Provider: Lamont DowdyBryson, Cathy Date of Office Visit: 05/23/2018  Subjective:   Bob Newman (DOB: [birth year]) is a 72 y.o. male who [facility] 05[facility]23/2018 in re-evaluation of the following:  HPI: [name] returns to this clinic in reevaluation of his asthma and allergic rhinitis and reflux induced respiratory disease.  His last visit to this clinic was 22 November 2017.  Overall he has had a very good interval of time with his respiratory tract and has not required a systemic steroid or antibiotic to treat any type of airway issue and rarely uses a short acting bronchodilator and can exert himself without any problem at all.  Occasionally gets some nasal stuffiness but no other significant upper airway symptoms.  His reflux has been a little bit more active since he discontinued consistent use of omeprazole.  He is now using omeprazole about every 3 days.  He has decreased his omeprazole use over the course of the past 10 days or so because his creatinine went up to 1.52 mg/DL.  It should be noted that on 11 August 2016 his creatinine was 1.50.  He did receive the flu vaccine this year.  Allergies as of 05/23/2018      Reactions   Latex Shortness Of Breath   Levaquin [levofloxacin] Hives   Miralax [polyethylene Glycol] Hives   Spiriva Handihaler [tiotropium Bromide Monohydrate] Hives      Medication List      amLODipine 5 MG tablet Commonly known as:  NORVASC 5 mg daily.   azelastine 0.1 % nasal spray Commonly known as:  ASTELIN Place 2 sprays into both nostrils 2 (two) times daily.   beclomethasone 80 MCG/ACT inhaler Commonly known as:  QVAR REDIHALER Inhale 2 puffs into the lungs 2 (two) times daily. Rinse, gargle, and spit after use.   candesartan 32 MG tablet Commonly known as:  ATACAND Take 32 mg by mouth daily.   CRESTOR 20 MG tablet Generic drug:  rosuvastatin Take 20 mg by mouth  daily.   dronedarone 400 MG tablet Commonly known as:  MULTAQ Take 400 mg by mouth 2 (two) times daily with a meal.   ELIQUIS 5 MG Tabs tablet Generic drug:  apixaban   fluticasone 50 MCG/ACT nasal spray Commonly known as:  FLONASE Place 2 sprays into both nostrils 2 (two) times daily.   IRON PO Take by mouth.   levalbuterol 45 MCG/ACT inhaler Commonly known as:  XOPENEX HFA Inhale two puffs every 4-6 hours if needed for cough/wheeze/shortness of breath.   metoprolol succinate 50 MG 24 hr tablet Commonly known as:  TOPROL-XL   montelukast 10 MG tablet Commonly known as:  SINGULAIR TAKE ONE TABLET ONCE DAILY   nitroGLYCERIN 0.4 MG SL tablet Commonly known as:  NITROSTAT   omeprazole 40 MG capsule Commonly known as:  PRILOSEC TAKE ONE CAPSULE BEFORE BREAKFAST DAILY   ranitidine 300 MG tablet Commonly known as:  ZANTAC Take one tablet by mouth every evening.   VITAMIN D PO Take by mouth daily.       Past Medical History:  Diagnosis Date  . Allergic rhinoconjunctivitis   . Asthma   . Atrial fibrillation (HCC)   . GERD (gastroesophageal reflux disease)   . High blood pressure     Past Surgical History:  Procedure Laterality Date  . CHOLECYSTECTOMY    . THROMBOENDARTERECTOMY Right 09/22/2016   Boykin ReaperKatharine Lillian [facility]  .  TONSILLECTOMY      Review of systems negative except as noted in HPI / PMHx or noted below:  Review of Systems  Constitutional: Negative.   HENT: Negative.   Eyes: Negative.   Respiratory: Negative.   Cardiovascular: Negative.   Gastrointestinal: Negative.   Genitourinary: Negative.   Musculoskeletal: Negative.   Skin: Negative.   Neurological: Negative.   Endo/Heme/Allergies: Negative.   Psychiatric/Behavioral: Negative.      Objective:   Vitals:   05/23/18 1100  BP: 140/84  Pulse: 63  Resp: 18   Height: 5\' 8"  (172.7 cm)      Physical Exam Constitutional:      Appearance: He is not diaphoretic.    HENT:     Head: Normocephalic.     Right Ear: Tympanic membrane, ear canal and external ear normal.     Left Ear: Tympanic membrane, ear canal and external ear normal.     Nose: Nose normal. No mucosal edema or rhinorrhea.     Mouth/Throat:     Pharynx: Uvula midline. No oropharyngeal exudate.  Eyes:     Conjunctiva/sclera: Conjunctivae normal.  Neck:     Thyroid: No thyromegaly.     Trachea: Trachea normal. No tracheal tenderness or tracheal deviation.  Cardiovascular:     Rate and Rhythm: Normal rate and regular rhythm.     Heart sounds: Normal heart sounds, S1 normal and S2 normal. No murmur.  Pulmonary:     Effort: No respiratory distress.     Breath sounds: Normal breath sounds. No stridor. No wheezing or rales.  Lymphadenopathy:     Head:     Right side of head: No tonsillar adenopathy.     Left side of head: No tonsillar adenopathy.     Cervical: No cervical adenopathy.  Skin:    Findings: No erythema or rash.     Nails: There is no clubbing.   Neurological:     Mental Status: He is alert.     Diagnostics:    Spirometry was performed and demonstrated an FEV1 of 2.33 at 81 % of predicted.  The patient had an Asthma Control Test with the following results: ACT Total Score: 20.    Assessment and Plan:   1. Asthma, moderate persistent, well-controlled   2. Other allergic rhinitis   3. LPRD (laryngopharyngeal reflux disease)   4. Moderate persistent asthma with acute exacerbation     1. Continue reflux therapy:   A. omeprazole 40 mg one time per day in a.m.  B. ranitidine 300 mg one time per day in p.m.  2. Continue anti-inflammatory therapy:    A. Flonase plus Astelin two times per day  B. Qvar 80 2 inhalations two times per day    C. montelukast 10 mg daily  3. If Needed:   A. Proventil HFA or Xopenex HFA if needed  B. Zyrtec one tablet one time per day   C. nasal saline multiple times a day   D. Mucinex DM 2 tablets twice a day   4. Return in 6  months or earlier if problem  Overall Donivin has really done relatively well on his current medical therapy which includes anti-inflammatory agents for both his upper and lower airway.  I do not think that he will be able to control his reflux on his current plan of omeprazole every third day and I have encouraged him to use a little bit more omeprazole as he goes through the week.  His creatinine appears to be  stable compared to the reading that he obtained in March 2018.  If he does well I will see him back in this clinic in 6 months or earlier if there is a problem.  Laurette Schimke, MD Allergy / Immunology [facility]

## 2018-05-24 ENCOUNTER — Encounter: Payer: Self-pay | Admitting: Allergy and Immunology

## 2018-05-27 DIAGNOSIS — J45909 Unspecified asthma, uncomplicated: Secondary | ICD-10-CM | POA: Insufficient documentation

## 2018-11-23 ENCOUNTER — Encounter: Payer: Self-pay | Admitting: Allergy and Immunology

## 2018-11-23 ENCOUNTER — Telehealth: Payer: Self-pay

## 2018-11-23 ENCOUNTER — Ambulatory Visit: Payer: Medicare PPO | Admitting: Allergy and Immunology

## 2018-11-23 ENCOUNTER — Other Ambulatory Visit: Payer: Self-pay

## 2018-11-23 VITALS — BP 142/64 | HR 64 | Temp 97.7°F | Resp 18

## 2018-11-23 DIAGNOSIS — J3089 Other allergic rhinitis: Secondary | ICD-10-CM

## 2018-11-23 DIAGNOSIS — K219 Gastro-esophageal reflux disease without esophagitis: Secondary | ICD-10-CM

## 2018-11-23 DIAGNOSIS — J4540 Moderate persistent asthma, uncomplicated: Secondary | ICD-10-CM

## 2018-11-23 NOTE — Patient Instructions (Addendum)
  1. Continue reflux therapy and monitoring of kidney function:   A.  Famotidine 40 mg - 1 tablet twice a day  B.  Follow-up with nephrologist regarding kidney function  2. Continue anti-inflammatory therapy:    A. Flonase plus Astelin two times per day  B. Qvar 80 2 inhalations two times per day    C. montelukast 10 mg daily  3. If Needed:   A. Proventil HFA or Xopenex HFA if needed  B. Zyrtec one tablet one time per day   C. nasal saline multiple times a day   D. Mucinex DM 2 tablets twice a day   4. Return in 6 months or earlier if problem  5. Obtain fall flu vaccine

## 2018-11-23 NOTE — Progress Notes (Signed)
[place]   Follow-up Note  Referring Provider: Jodi Mourning, NP Primary Provider: Jodi Mourning, NP Date of Office Visit: 11/23/2018  Subjective:   Bob Newman (DOB: 1946/05/25) is a 72 y.o. male who returns to the [facility] [place] on 11/23/2018 in re-evaluation of the following:  HPI: Donovan returns to this clinic in evaluation of asthma and allergic rhinitis and LPR.  His last visit to this clinic was 23 May 2018.  Overall he has done very well with his airway and has not required a systemic steroid or an antibiotic for any type of airway issue and rarely uses a short acting bronchodilator while he continues to use Qvar and montelukast and Flonase on a consistent basis.  Sometimes during the spring he does get some problems with nasal congestion and sneezing but otherwise has really done very well during the interval.  He was taken off his proton pump inhibitor by his primary care doctor secondary to his kidney function.  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  Since he has been off his proton pump inhibitor he had very severe heartburn and regurgitation.  He increased his famotidine to twice a day and he is doing well at this point with his reflux on a total of 80 mg famotidine daily.  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  Fortunately, everything healed up well.  Allergies as of 11/23/2018      Reactions   Latex Shortness Of Breath   Levaquin [levofloxacin] Hives   Miralax [polyethylene Glycol] Hives   Spiriva Handihaler [tiotropium Bromide Monohydrate] Hives      Medication List      amLODipine 5 MG tablet Commonly known as:  NORVASC 5 mg daily.   azelastine 0.1 % nasal spray Commonly known as: ASTELIN Place 2 sprays into both nostrils 2 (two) times daily.   beclomethasone 80 MCG/ACT inhaler Commonly known as: Qvar RediHaler Inhale 2 puffs into the lungs 2 (two) times daily. Rinse, gargle, and spit after use.   candesartan 32 MG tablet Commonly known as: ATACAND Take 32 mg by mouth daily.   Crestor 20 MG tablet Generic drug: rosuvastatin Take 20 mg by mouth daily.   dronedarone 400 MG tablet Commonly known as: MULTAQ Take 400 mg by mouth 2 (two) times daily with a meal.   Eliquis 5 MG Tabs tablet Generic drug: apixaban   famotidine 40 MG tablet Commonly known as: PEPCID Take 40 mg by mouth daily.   fluticasone 50 MCG/ACT nasal spray Commonly known as: FLONASE Place 2 sprays into both nostrils 2 (two) times daily.   IRON PO Take by mouth.   levalbuterol 45 MCG/ACT inhaler Commonly known as: Xopenex HFA Inhale two puffs every 4-6 hours if needed for cough/wheeze/shortness of breath.   metoprolol succinate 50 MG 24 hr tablet Commonly known as: TOPROL-XL Take 25 mg by mouth daily.   montelukast 10 MG tablet Commonly known as: SINGULAIR TAKE ONE TABLET ONCE DAILY   nitroGLYCERIN 0.4 MG SL tablet Commonly known as: NITROSTAT   omeprazole 40 MG capsule Commonly known as: PriLOSEC TAKE ONE CAPSULE BEFORE BREAKFAST DAILY   ranitidine 300 MG tablet Commonly known  as: ZANTAC Take one tablet by mouth every evening.   VITAMIN D PO Take by mouth daily.       Past Medical History:  Diagnosis Date  . Allergic rhinoconjunctivitis   . Asthma   . Atrial fibrillation (HCC)   . GERD (gastroesophageal reflux disease)   . High blood pressure     Past Surgical History:  Procedure Laterality Date  . CHOLECYSTECTOMY    . THROMBOENDARTERECTOMY Right 09/22/2016   [facility]kin ReaperKatharine [facility]  . TONSILLECTOMY      Review of systems negative except as noted in HPI / PMHx or  noted below:  Review of Systems  Constitutional: Negative.   HENT: Negative.   Eyes: Negative.   Respiratory: Negative.   Cardiovascular: Negative.   Gastrointestinal: Negative.   Genitourinary: Negative.   Musculoskeletal: Negative.   Skin: Negative.   Neurological: Negative.   Endo/Heme/Allergies: Negative.   Psychiatric/Behavioral: Negative.      Objective:   Vitals:   11/23/18 1042  BP: (!) 142/64  Pulse: 64  Resp: 18  Temp: 97.7 F (36.5 C)  SpO2: 96%          Physical Exam Constitutional:      Appearance: He is not diaphoretic.  HENT:     Head: Normocephalic.     Right Ear: Tympanic membrane, ear canal and external ear normal.     Left Ear: Tympanic membrane, ear canal and external ear normal.     Nose: Nose normal. No mucosal edema or rhinorrhea.     Mouth/Throat:     Pharynx: Uvula midline. No oropharyngeal exudate.  Eyes:     Conjunctiva/sclera: Conjunctivae normal.  Neck:     Thyroid: No thyromegaly.     Trachea: Trachea normal. No tracheal tenderness or tracheal deviation.  Cardiovascular:     Rate and Rhythm: Normal rate and regular rhythm.     Heart sounds: Normal heart sounds, S1 normal and S2 normal. No murmur.  Pulmonary:     Effort: No respiratory distress.     Breath sounds: Normal breath sounds. No stridor. No wheezing or rales.  Lymphadenopathy:     Head:     Right side of head: No tonsillar adenopathy.     Left side of head: No tonsillar adenopathy.     Cervical: No cervical adenopathy.  Skin:    Findings: No erythema or rash.     Nails: There is no clubbing.   Neurological:     Mental Status: He is alert.     Diagnostics:    Spirometry was performed and demonstrated an FEV1 of 2.33 at 82 % of predicted.  Assessment and Plan:   1. Asthma, moderate persistent, well-controlled   2. Other allergic rhinitis   3. LPRD (laryngopharyngeal reflux disease)     1. Continue reflux therapy and monitoring of kidney function:   A.   Famotidine 40 mg - 1 tablet twice a day  B.  Follow-up with nephrologist regarding kidney function  2. Continue anti-inflammatory therapy:    A. Flonase plus Astelin two times per day  B. Qvar 80 2 inhalations two times per day    C. montelukast 10 mg daily  3. If Needed:   A. Proventil HFA or Xopenex HFA if needed  B. Zyrtec one tablet one time per day   C. nasal saline multiple times a day   D. Mucinex DM 2 tablets twice a day   4. Return in 6 months or earlier if problem  5. Obtain fall flu vaccine  Overall [name] appears to be doing relatively well.  I think it would be worthwhile for him to follow-up with his nephrologist regarding management of his kidney disease and to address the issue of treating his reflux.  It does not really appear as though the use of a proton pump inhibitor has changed his creatinine levels over several years but removing use of a proton pump inhibitor may be the safest thing to do.  He will remain on anti-inflammatory agents for his airway as noted above.  I will see him back in this clinic in 6 months or earlier if there is a problem.  Laurette SchimkeEric , MD Allergy / Immunology [facility]

## 2018-11-23 NOTE — Telephone Encounter (Signed)
Left message for patient to call the office.  Please let him know that we can refer him to [facility] in [place] to get him set up with a nephrologist.  If he is agreeable with this, please make referral.

## 2018-11-24 ENCOUNTER — Encounter: Payer: Self-pay | Admitting: Allergy and Immunology

## 2018-12-02 NOTE — Telephone Encounter (Signed)
Left message for patient to call the office regarding referral to kidney doctor.

## 2018-12-14 NOTE — Telephone Encounter (Signed)
Patient talked with Elmyra Ricks at front desk.  Per Elmyra Ricks, he is going to wait until he sees PCP in August and get blood work and then let us know.

## 2018-12-14 NOTE — Telephone Encounter (Signed)
Left message for patient to call the office

## 2019-05-25 ENCOUNTER — Other Ambulatory Visit: Payer: Self-pay

## 2019-05-25 ENCOUNTER — Ambulatory Visit (INDEPENDENT_AMBULATORY_CARE_PROVIDER_SITE_OTHER): Payer: Medicare PPO | Admitting: Allergy and Immunology

## 2019-05-25 ENCOUNTER — Encounter: Payer: Self-pay | Admitting: Allergy and Immunology

## 2019-05-25 VITALS — BP 150/68 | HR 76 | Temp 97.6°F | Resp 18 | Ht 67.5 in | Wt 214.2 lb

## 2019-05-25 DIAGNOSIS — K219 Gastro-esophageal reflux disease without esophagitis: Secondary | ICD-10-CM

## 2019-05-25 DIAGNOSIS — J4540 Moderate persistent asthma, uncomplicated: Secondary | ICD-10-CM | POA: Diagnosis not present

## 2019-05-25 DIAGNOSIS — J3089 Other allergic rhinitis: Secondary | ICD-10-CM

## 2019-05-25 MED ORDER — MONTELUKAST SODIUM 10 MG PO TABS: ORAL_TABLET | ORAL | 1 refills | Status: DC

## 2019-05-25 MED ORDER — AZELASTINE HCL 0.1 % NA SOLN: 2.0000 | Freq: Two times a day (BID) | NASAL | 1 refills | Status: DC

## 2019-05-25 MED ORDER — FLUTICASONE PROPIONATE 50 MCG/ACT NA SUSP: 2.0000 | Freq: Two times a day (BID) | NASAL | 1 refills | Status: DC

## 2019-05-25 MED ORDER — FAMOTIDINE 40 MG PO TABS: 40.0000 mg | ORAL_TABLET | Freq: Two times a day (BID) | ORAL | 1 refills | Status: DC

## 2019-05-25 MED ORDER — QVAR REDIHALER 80 MCG/ACT IN AERB: INHALATION_SPRAY | RESPIRATORY_TRACT | 1 refills | Status: DC

## 2019-05-25 NOTE — Patient Instructions (Signed)
  1. Continue reflux therapy:   A.  Famotidine 40 mg - 1 tablet twice a day  2. Continue anti-inflammatory therapy:    A. Flonase plus Astelin two times per day  B. Qvar 80 2 inhalations two times per day    C. montelukast 10 mg daily  3. If Needed:   A. Proventil HFA or Xopenex HFA if needed  B. Zyrtec one tablet one time per day   C. nasal saline multiple times a day   D. Mucinex DM 2 tablets twice a day   4. Return in 6 months or earlier if problem  5. Obtain Covid vaccine when available

## 2019-05-25 NOTE — Progress Notes (Signed)
[place]   Follow-up Note  Referring Provider: Jodi Mourning, NP Primary Provider: Jodi Mourning, NP Date of Office Visit: 05/25/2019  Subjective:   Bob Newman (DOB: [birth year]) is a 73 y.o. male who returns to the [facility] [place] on 05/25/2019 in re-evaluation of the following:  HPI: Bob Newman returns to this clinic in reevaluation of asthma and allergic rhinitis and LPR.  His last visit to this clinic was 23 November 2018.  He has had another wonderful interval of time without the need for systemic steroid or antibiotic and rarely uses short acting bronchodilator and he can exert himself with walking with no difficulty at all while consistently using Qvar at 160 mcg twice a day and montelukast consistently.  Likewise, his nose has really been doing quite well while using a combination of Flonase and azelastine.  His reflux is intermittently active.  He is using famotidine twice a day.  He has been taken off his proton pump inhibitor because of his kidney function.  He is not really sure what his current kidney function is as he has not had good contact with his nephrologist recently.  He did obtain the flu vaccine this year.  Allergies as of 05/25/2019      Reactions   Latex Shortness Of Breath   Levaquin [levofloxacin] Hives   Miralax [polyethylene Glycol] Hives   Spiriva Handihaler [tiotropium Bromide Monohydrate] Hives      Medication List      amLODipine 5 MG tablet Commonly known as: NORVASC 5 mg daily.   azelastine 0.1 % nasal spray Commonly known as: ASTELIN Place 2 sprays into both nostrils 2 (two) times daily.   beclomethasone 80 MCG/ACT inhaler Commonly known as: Qvar RediHaler Inhale 2 puffs into the lungs 2 (two) times daily. Rinse, gargle, and spit after use.   candesartan 32 MG tablet Commonly known as: ATACAND Take 32 mg by mouth daily.   Crestor 20 MG tablet Generic drug: rosuvastatin  Take 20 mg by mouth daily.   dronedarone 400 MG tablet Commonly known as: MULTAQ Take 400 mg by mouth 2 (two) times daily with a meal.   Eliquis 5 MG Tabs tablet Generic drug: apixaban   famotidine 40 MG tablet Commonly known as: PEPCID Take 40 mg by mouth daily.   fluticasone 50 MCG/ACT nasal spray Commonly known as: FLONASE Place 2 sprays into both nostrils 2 (two) times daily.   IRON PO Take by mouth.   levalbuterol 45 MCG/ACT inhaler Commonly known as: Xopenex HFA Inhale two puffs every 4-6 hours if needed for cough/wheeze/shortness of breath.   metoprolol succinate 50 MG 24 hr tablet Commonly known as: TOPROL-XL Take 25 mg by mouth daily.   montelukast 10 MG tablet Commonly known as: SINGULAIR TAKE ONE TABLET ONCE DAILY   nitroGLYCERIN 0.4 MG SL tablet Commonly known as: NITROSTAT   omeprazole 40 MG capsule Commonly known as: PriLOSEC TAKE ONE CAPSULE BEFORE BREAKFAST DAILY   ranitidine 300 MG tablet Commonly known as: ZANTAC Take one tablet by mouth every evening.   vitamin B-12 500 MCG tablet Commonly known as: CYANOCOBALAMIN Take 500 mcg by mouth daily.   VITAMIN D PO Take by mouth daily.       Past Medical History:  Diagnosis Date  . Allergic rhinoconjunctivitis   . Asthma   . Atrial fibrillation ([place])   . GERD (gastroesophageal reflux disease)   . High blood pressure  Past Surgical History:  Procedure Laterality Date  . CHOLECYSTECTOMY    . THROMBOENDARTERECTOMY Right 09/22/2016   Boykin Reaper, MD at [facility]  . TONSILLECTOMY      Review of systems negative except as noted in HPI / PMHx or noted below:  Review of Systems  Constitutional: Negative.   HENT: Negative.   Eyes: Negative.   Respiratory: Negative.   Cardiovascular: Negative.   Gastrointestinal: Negative.   Genitourinary: Negative.   Musculoskeletal: Negative.   Skin: Negative.   Neurological: Negative.   Endo/Heme/Allergies: Negative.    Psychiatric/Behavioral: Negative.      Objective:   Vitals:   05/25/19 1131 05/25/19 1154  BP: (!) 160/76 (!) 150/68  Pulse: 76   Resp: 18   Temp: 97.6 F (36.4 C)   SpO2: 97%    Height: 5' 7.5" (171.5 cm)  Weight: 214 lb 3.2 oz (97.2 kg)   Physical Exam Constitutional:      Appearance: He is not diaphoretic.  HENT:     Head: Normocephalic.     Right Ear: Tympanic membrane, ear canal and external ear normal.     Left Ear: Tympanic membrane, ear canal and external ear normal.     Nose: Nose normal. No mucosal edema or rhinorrhea.     Mouth/Throat:     Pharynx: Uvula midline. No oropharyngeal exudate.  Eyes:     Conjunctiva/sclera: Conjunctivae normal.  Neck:     Thyroid: No thyromegaly.     Trachea: Trachea normal. No tracheal tenderness or tracheal deviation.  Cardiovascular:     Rate and Rhythm: Normal rate and regular rhythm.     Heart sounds: Normal heart sounds, S1 normal and S2 normal. No murmur.  Pulmonary:     Effort: No respiratory distress.     Breath sounds: Normal breath sounds. No stridor. No wheezing or rales.  Lymphadenopathy:     Head:     Right side of head: No tonsillar adenopathy.     Left side of head: No tonsillar adenopathy.     Cervical: No cervical adenopathy.  Skin:    Findings: No erythema or rash.     Nails: There is no clubbing.  Neurological:     Mental Status: He is alert.     Diagnostics:    Spirometry was performed and demonstrated an FEV1 of 2.96 at 110 % of predicted.  Assessment and Plan:   1. Asthma, moderate persistent, well-controlled   2. Other allergic rhinitis   3. LPRD (laryngopharyngeal reflux disease)     1. Continue reflux therapy:   A.  Famotidine 40 mg - 1 tablet twice a day  2. Continue anti-inflammatory therapy:    A. Flonase plus Astelin two times per day  B. Qvar 80 2 inhalations two times per day    C. montelukast 10 mg daily  3. If Needed:   A. Proventil HFA or Xopenex HFA if needed  B.  Zyrtec one tablet one time per day   C. nasal saline multiple times a day   D. Mucinex DM 2 tablets twice a day   4. Return in 6 months or earlier if problem  5. Obtain Covid vaccine when available  Bob Newman has really done very well on his current therapy regarding his respiratory tract.  His reflux is marginally controlled.  Assuming he continues to do well with this plan I will see him back in this clinic in 6 months or earlier if there is a problem.  Laurette Schimke, MD  Allergy / Immunology [facility]

## 2019-05-29 ENCOUNTER — Encounter: Payer: Self-pay | Admitting: Allergy and Immunology

## 2019-08-01 ENCOUNTER — Other Ambulatory Visit: Payer: Self-pay | Admitting: *Deleted

## 2019-08-01 ENCOUNTER — Telehealth: Payer: Self-pay | Admitting: Allergy and Immunology

## 2019-08-01 MED ORDER — FLUTICASONE PROPIONATE 50 MCG/ACT NA SUSP: 2.0000 | Freq: Two times a day (BID) | NASAL | 0 refills | Status: DC

## 2019-08-01 MED ORDER — QVAR REDIHALER 80 MCG/ACT IN AERB: INHALATION_SPRAY | RESPIRATORY_TRACT | 0 refills | Status: DC

## 2019-08-01 MED ORDER — FAMOTIDINE 40 MG PO TABS: 40.0000 mg | ORAL_TABLET | Freq: Two times a day (BID) | ORAL | 0 refills | Status: DC

## 2019-08-01 NOTE — Telephone Encounter (Signed)
Patient came in to the office with a note written on an envelope of all the refill she needs.  Jefferie would like a refill of Prilosec, Flonase, and Qvar sent to [facility] in [place].

## 2019-08-01 NOTE — Telephone Encounter (Signed)
Rx SENT

## 2019-11-23 ENCOUNTER — Other Ambulatory Visit: Payer: Self-pay

## 2019-11-23 ENCOUNTER — Ambulatory Visit (INDEPENDENT_AMBULATORY_CARE_PROVIDER_SITE_OTHER): Payer: Medicare PPO | Admitting: Allergy and Immunology

## 2019-11-23 ENCOUNTER — Encounter: Payer: Self-pay | Admitting: Allergy and Immunology

## 2019-11-23 VITALS — BP 136/64 | HR 65 | Resp 18 | Ht 67.25 in | Wt 220.0 lb

## 2019-11-23 DIAGNOSIS — J4540 Moderate persistent asthma, uncomplicated: Secondary | ICD-10-CM | POA: Diagnosis not present

## 2019-11-23 DIAGNOSIS — J3089 Other allergic rhinitis: Secondary | ICD-10-CM | POA: Diagnosis not present

## 2019-11-23 DIAGNOSIS — K219 Gastro-esophageal reflux disease without esophagitis: Secondary | ICD-10-CM | POA: Diagnosis not present

## 2019-11-23 NOTE — Progress Notes (Signed)
[place] [facility] [place] [facility] [place] [facility] [name] [facility] [place]   Follow-up Note  Referring Provider: Stormy Fabian, NP Primary Provider: Stormy Fabian, NP Date of Office Visit: 11/23/2019  Subjective:   Bob Newman (DOB: 1946/02/16) is a 74 y.o. male who [facility] 11[facility]/2019 in re-evaluation of the following:  HPI: Bob Newman returns to this clinic in evaluation of asthma and allergic rhinitis and LPR.  His last visit to this clinic was 25 May 2019.  Overall he has really done well with his asthma and rarely uses a short acting bronchodilator and can exert himself without any problem at all.  Yesterday he apparently did develop a little bit of a cough for some reason without an obvious trigger.  Otherwise, he has not required a systemic steroid or antibiotic for an airway issue since I have seen him in this clinic.  His nose has been doing pretty well on his current medical plan.  He still has intermittent reflux.  He has had his omeprazole added back to his famotidine based upon the fact that his kidney function appears to be very stable.  His last creatinine obtained the spring was 1.5 mg/DL.  He has had his omeprazole returned because he is developing intermittent regurgitation events at night.  He has gained significant weight over the past 6 months.  He has had 2 Moderna Covid vaccinations.  Allergies as of 11/23/2019      Reactions   Latex Shortness Of Breath   Levaquin [levofloxacin] Hives   Miralax [polyethylene Glycol] Hives   Spiriva Handihaler [tiotropium Bromide Monohydrate] Hives      Medication List      amLODipine 5 MG tablet Commonly known as: NORVASC 5 mg daily.   azelastine 0.1 % nasal spray Commonly known as: ASTELIN Place 2 sprays into both nostrils 2 (two) times daily.   candesartan 32 MG tablet Commonly known as: ATACAND Take 32 mg by mouth daily.   Crestor 20 MG tablet Generic drug: rosuvastatin Take 20 mg  by mouth daily.   dronedarone 400 MG tablet Commonly known as: MULTAQ Take 400 mg by mouth 2 (two) times daily with a meal.   Eliquis 5 MG Tabs tablet Generic drug: apixaban   famotidine 40 MG tablet Commonly known as: PEPCID Take 1 tablet (40 mg total) by mouth 2 (two) times daily.   fluticasone 50 MCG/ACT nasal spray Commonly known as: FLONASE Place 2 sprays into both nostrils 2 (two) times daily.   IRON PO Take by mouth.   levalbuterol 45 MCG/ACT inhaler Commonly known as: Xopenex HFA Inhale two puffs every 4-6 hours if needed for cough/wheeze/shortness of breath.   metoprolol succinate 50 MG 24 hr tablet Commonly known as: TOPROL-XL Take 25 mg by mouth daily.   montelukast 10 MG tablet Commonly known as: SINGULAIR TAKE ONE TABLET ONCE DAILY   nitroGLYCERIN 0.4 MG SL tablet Commonly known as: NITROSTAT   omeprazole 40 MG capsule Commonly known as: PRILOSEC Take 40 mg by mouth daily.   Qvar RediHaler 80 MCG/ACT inhaler Generic drug: beclomethasone Inhale two doses twice daily to prevent cough or wheeze.  Rinse, gargle, and spit after use.   vitamin B-12 500 MCG tablet Commonly known as: CYANOCOBALAMIN Take 500 mcg by mouth daily.   VITAMIN D PO Take by mouth daily.       Past Medical History:  Diagnosis Date  . Allergic rhinoconjunctivitis   . Asthma   . Atrial fibrillation (  HCC)   . GERD (gastroesophageal reflux disease)   . High blood pressure     Past Surgical History:  Procedure Laterality Date  . CHOLECYSTECTOMY    . THROMBOENDARTERECTOMY Right 09/22/2016   Letitia Caul, MD at [facility]  . TONSILLECTOMY      Review of systems negative except as noted in HPI / PMHx or noted below:  Review of Systems  Constitutional: Negative.   HENT: Negative.   Eyes: Negative.   Respiratory: Negative.   Cardiovascular: Negative.   Gastrointestinal: Negative.   Genitourinary: Negative.   Musculoskeletal: Negative.   Skin: Negative.     Neurological: Negative.   Endo/Heme/Allergies: Negative.   Psychiatric/Behavioral: Negative.      Objective:   Vitals:   11/23/19 1115  BP: 136/64  Pulse: 65  Resp: 18  SpO2: 95%   Height: 5' 7.25" (170.8 cm)  Weight: 220 lb (99.8 kg)   Physical Exam Constitutional:      Appearance: He is not diaphoretic.  HENT:     Head: Normocephalic.     Right Ear: Tympanic membrane, ear canal and external ear normal.     Left Ear: Tympanic membrane, ear canal and external ear normal.     Nose: Nose normal. No mucosal edema or rhinorrhea.     Mouth/Throat:     Pharynx: Uvula midline. No oropharyngeal exudate.  Eyes:     Conjunctiva/sclera: Conjunctivae normal.  Neck:     Thyroid: No thyromegaly.     Trachea: Trachea normal. No tracheal tenderness or tracheal deviation.  Cardiovascular:     Rate and Rhythm: Normal rate and regular rhythm.     Heart sounds: Normal heart sounds, S1 normal and S2 normal. No murmur heard.   Pulmonary:     Effort: No respiratory distress.     Breath sounds: Normal breath sounds. No stridor. No wheezing or rales.  Lymphadenopathy:     Head:     Right side of head: No tonsillar adenopathy.     Left side of head: No tonsillar adenopathy.     Cervical: No cervical adenopathy.  Skin:    Findings: No erythema or rash.     Nails: There is no clubbing.  Neurological:     Mental Status: He is alert.     Diagnostics:    Spirometry was performed and demonstrated an FEV1 of 2.57 at 97 % of predicted.  Assessment and Plan:   1. Asthma, moderate persistent, well-controlled   2. Other allergic rhinitis   3. LPRD (laryngopharyngeal reflux disease)     1. Continue reflux therapy:   A.  Omeprazole 40 mg - 1 tablet in AM  B.  Famotidine 40 mg - 1 tablet in PM  C.  Use a plan to decrease weight  2. Continue anti-inflammatory therapy:    A. Flonase plus Astelin two times per day  B. Qvar 80 2 inhalations two times per day    C. montelukast 10 mg  daily  3. If Needed:   A. Proventil HFA or Xopenex HFA if needed  B. Zyrtec one tablet one time per day   C. nasal saline multiple times a day   D. Mucinex DM 2 tablets twice a day   4. "Action Plan" for flare up:   A. Increase Qvar to 3 inhalations 3 times per day  B. Use Albuterol HFA if needed  4. Return in 6 months or earlier if problem  Overall Shawon has done relatively well with his inflammatory condition  of his airway and his reflux induced respiratory disease on his current plan although he has had a little bit more activity of his reflux recently requiring him to add in omeprazole to his famotidine.  As well, he has gained weight which is definitely going to affect his reflux and we had a long talk today about the need to come up with a plan for him to obtain and maintain an ideal body weight in the future.  He will continue to use a collection of anti-inflammatory agents for his airway and I will see him back in his clinic in 6 months or earlier if there is a problem.  Laurette Schimke, MD Allergy / Immunology [facility]

## 2019-11-23 NOTE — Patient Instructions (Addendum)
  1. Continue reflux therapy:   A.  Omeprazole 40 mg - 1 tablet in AM  B.  Famotidine 40 mg - 1 tablet in PM  C.  Use a plan to decrease weight  2. Continue anti-inflammatory therapy:    A. Flonase plus Astelin two times per day  B. Qvar 80 2 inhalations two times per day    C. montelukast 10 mg daily  3. If Needed:   A. Proventil HFA or Xopenex HFA if needed  B. Zyrtec one tablet one time per day   C. nasal saline multiple times a day   D. Mucinex DM 2 tablets twice a day   4. "Action Plan" for flare up:   A. Increase Qvar to 3 inhalations 3 times per day  B. Use Albuterol HFA if needed  4. Return in 6 months or earlier if problem

## 2019-11-27 ENCOUNTER — Encounter: Payer: Self-pay | Admitting: Allergy and Immunology

## 2020-02-09 ENCOUNTER — Other Ambulatory Visit: Payer: Self-pay | Admitting: Nephrology

## 2020-02-09 DIAGNOSIS — N1832 Chronic kidney disease, stage 3b: Secondary | ICD-10-CM

## 2020-02-19 ENCOUNTER — Ambulatory Visit
Admission: RE | Admit: 2020-02-19 | Discharge: 2020-02-19 | Disposition: A | Discharge status: Home / Self Care | Payer: Medicare PPO | Source: Ambulatory Visit | Attending: Nephrology | Admitting: Nephrology

## 2020-02-19 ENCOUNTER — Other Ambulatory Visit: Payer: Self-pay

## 2020-02-19 DIAGNOSIS — N1832 Chronic kidney disease, stage 3b: Secondary | ICD-10-CM

## 2020-05-23 ENCOUNTER — Other Ambulatory Visit: Payer: Self-pay

## 2020-05-23 ENCOUNTER — Ambulatory Visit (INDEPENDENT_AMBULATORY_CARE_PROVIDER_SITE_OTHER): Payer: Medicare PPO | Admitting: Allergy and Immunology

## 2020-05-23 VITALS — BP 144/64 | HR 56 | Resp 16

## 2020-05-23 DIAGNOSIS — J3089 Other allergic rhinitis: Secondary | ICD-10-CM | POA: Diagnosis not present

## 2020-05-23 DIAGNOSIS — K219 Gastro-esophageal reflux disease without esophagitis: Secondary | ICD-10-CM | POA: Diagnosis not present

## 2020-05-23 DIAGNOSIS — J4541 Moderate persistent asthma with (acute) exacerbation: Secondary | ICD-10-CM | POA: Diagnosis not present

## 2020-05-23 DIAGNOSIS — J4540 Moderate persistent asthma, uncomplicated: Secondary | ICD-10-CM | POA: Diagnosis not present

## 2020-05-23 MED ORDER — OMEPRAZOLE 40 MG PO CPDR: 40.0000 mg | DELAYED_RELEASE_CAPSULE | Freq: Every day | ORAL | 1 refills | Status: DC

## 2020-05-23 MED ORDER — MONTELUKAST SODIUM 10 MG PO TABS: ORAL_TABLET | ORAL | 1 refills | Status: DC

## 2020-05-23 MED ORDER — AZELASTINE HCL 0.1 % NA SOLN: NASAL | 1 refills | Status: DC

## 2020-05-23 MED ORDER — MUPIROCIN 2 % EX OINT: TOPICAL_OINTMENT | CUTANEOUS | 0 refills | Status: DC

## 2020-05-23 MED ORDER — FAMOTIDINE 40 MG PO TABS
40.0000 mg | ORAL_TABLET | Freq: Two times a day (BID) | ORAL | 1 refills | Status: DC
Start: 2020-05-23 — End: 2021-05-21

## 2020-05-23 MED ORDER — FLUTICASONE PROPIONATE 50 MCG/ACT NA SUSP: NASAL | 1 refills | Status: DC

## 2020-05-23 MED ORDER — QVAR REDIHALER 80 MCG/ACT IN AERB: INHALATION_SPRAY | RESPIRATORY_TRACT | 1 refills | Status: DC

## 2020-05-23 MED ORDER — LEVALBUTEROL TARTRATE 45 MCG/ACT IN AERO: INHALATION_SPRAY | RESPIRATORY_TRACT | 1 refills | Status: DC

## 2020-05-23 NOTE — Patient Instructions (Addendum)
  1. Continue reflux therapy:   A.  Omeprazole 40 mg - 1 tablet in AM  B.  Famotidine 40 mg - 1 tablet in PM  C.  Use a plan to decrease weight  2. Continue anti-inflammatory therapy for chest:    A. Qvar 80 2 inhalations two times per day    B. montelukast 10 mg daily  3. Continue anti-inflammatory therapy for nose:   A. Afrin - 1 spray in single nostril in evening. Alternate nostrils  B. flonase - 1 spray in both nostrils in evening  C. Astelin - 1 spray in both nostril in evening  D. flonase + astelin - 1 spray each in both nostrils in morning  4. Treat nasal infection:   A. Bactroban - place in both nostrils 3 times a day for 10 days  5. If Needed:   A. Proventil HFA or Xopenex HFA if needed  B. Zyrtec one tablet one time per day   C. nasal saline multiple times a day   D. Mucinex DM 2 tablets twice a day   6. "Action Plan" for flare up:   A. Increase Qvar to 3 inhalations 3 times per day  B. Use Albuterol HFA if needed  7.  Blood -TSH, free T4, thyroid peroxidase antibody, CBC with differential  8. Return in 6 months or earlier if problem

## 2020-05-23 NOTE — Progress Notes (Signed)
[place] - [place] - [place] - [name] -    Follow-up Note  Referring Provider: Stormy Fabian, NP Primary Provider: Stormy Fabian, NP Date of Office Visit: 05/23/2020  Subjective:   Bob Newman (DOB: [birth year]) is a 74 y.o. male who [facility] in re-evaluation of the following:  HPI: Bob Newman returns to this clinic in evaluation of asthma and allergic rhinitis and LPR.  His last visit to this clinic was 23 November 2019.  His main complaint at this point in time is the fact that he has a "sinus infection".  He has some burning in his nose and nasal congestion and he obliterates his upper airway at nighttime and he cannot breathe through his nose.  This occurs even though he has been using some nasal antihistamine and nasal steroid.  He apparently visited with his primary care doctor and was treated with Augmentin at the beginning of December which did nothing for him.  His asthma is good.  He does develop a little cough when he wakes up in the morning but otherwise rarely uses a short acting bronchodilator.  He has not required a systemic steroid to treat an exacerbation of this issue.  His reflux has been under very good control.    He has received 3 Moderna Covid vaccinations and the flu vaccine this year.  His kidney function is now being followed by nephrologist, Dr. Hyman Hopes, and apparently he has stable kidney function.  He might have been told that he has a thyroid issue that might have been affecting his kidney function.  He is not on any type of thyroid supplements.  Allergies as of 05/23/2020      Reactions   Latex Shortness Of Breath   Levaquin [levofloxacin] Hives   Miralax [polyethylene Glycol] Hives   Spiriva Handihaler [tiotropium Bromide Monohydrate] Hives      Medication List      amLODipine 5 MG tablet Commonly known as: NORVASC 5 mg daily.   azelastine 0.1 % nasal spray Commonly known as:  ASTELIN Place 2 sprays into both nostrils 2 (two) times daily.   azelastine 0.1 % nasal spray Commonly known as: ASTELIN 1 spray per nostril in both nostrils in the morning and the evening.   candesartan 32 MG tablet Commonly known as: ATACAND Take 32 mg by mouth daily.   Crestor 20 MG tablet Generic drug: rosuvastatin Take 20 mg by mouth daily.   dronedarone 400 MG tablet Commonly known as: MULTAQ Take 400 mg by mouth 2 (two) times daily with a meal.   Eliquis 5 MG Tabs tablet Generic drug: apixaban   esomeprazole 40 MG capsule Commonly known as: NEXIUM   famotidine 40 MG tablet Commonly known as: PEPCID Take 1 tablet (40 mg total) by mouth 2 (two) times daily.   fluticasone 50 MCG/ACT nasal spray Commonly known as: FLONASE One spray in both nostrils in the morning and the evening   IRON PO Take by mouth.   levalbuterol 45 MCG/ACT inhaler Commonly known as: Xopenex HFA Inhale two puffs every 4-6 hours if needed for cough/wheeze/shortness of breath.   metoprolol succinate 50 MG 24 hr tablet Commonly known as: TOPROL-XL Take 25 mg by mouth daily.   montelukast 10 MG tablet Commonly known as: SINGULAIR TAKE ONE TABLET ONCE DAILY   mupirocin ointment 2 % Commonly known as: BACTROBAN Place in both nostrils 3 times a day for 10 days. Started by: Jessica Priest, MD  nitroGLYCERIN 0.4 MG SL tablet Commonly known as: NITROSTAT   omeprazole 40 MG capsule Commonly known as: PRILOSEC Take 1 capsule (40 mg total) by mouth daily.   Qvar RediHaler 80 MCG/ACT inhaler Generic drug: beclomethasone Inhale two doses twice daily to prevent cough or wheeze.  Rinse, gargle, and spit after use.   vitamin B-12 500 MCG tablet Commonly known as: CYANOCOBALAMIN Take 500 mcg by mouth daily.   VITAMIN D PO Take by mouth daily.       Past Medical History:  Diagnosis Date  . Allergic rhinoconjunctivitis   . Asthma   . Atrial fibrillation (HCC)   . GERD  (gastroesophageal reflux disease)   . High blood pressure     Past Surgical History:  Procedure Laterality Date  . CHOLECYSTECTOMY    . THROMBOENDARTERECTOMY Right 09/22/2016   Boykin Reaper, MD at [facility]  . TONSILLECTOMY      Review of systems negative except as noted in HPI / PMHx or noted below:  Review of Systems  Constitutional: Negative.   HENT: Negative.   Eyes: Negative.   Respiratory: Negative.   Cardiovascular: Negative.   Gastrointestinal: Negative.   Genitourinary: Negative.   Musculoskeletal: Negative.   Skin: Negative.   Neurological: Negative.   Endo/Heme/Allergies: Negative.   Psychiatric/Behavioral: Negative.      Objective:   Vitals:   05/23/20 1106 05/23/20 1133  BP: (!) 148/68 (!) 144/64  Pulse: (!) 56   Resp: 16   SpO2: 96%           Physical Exam Constitutional:      Appearance: He is not diaphoretic.  HENT:     Head: Normocephalic.     Right Ear: Tympanic membrane, ear canal and external ear normal.     Left Ear: Tympanic membrane, ear canal and external ear normal.     Nose: Mucosal edema (Erythematous) present. No rhinorrhea.     Mouth/Throat:     Mouth: Oropharynx is clear and moist and mucous membranes are normal.     Pharynx: Uvula midline. No oropharyngeal exudate.  Eyes:     Conjunctiva/sclera: Conjunctivae normal.  Neck:     Thyroid: No thyromegaly.     Trachea: Trachea normal. No tracheal tenderness or tracheal deviation.  Cardiovascular:     Rate and Rhythm: Normal rate and regular rhythm.     Heart sounds: Normal heart sounds, S1 normal and S2 normal. No murmur heard.   Pulmonary:     Effort: No respiratory distress.     Breath sounds: Normal breath sounds. No stridor. No wheezing or rales.  Musculoskeletal:        General: No edema.  Lymphadenopathy:     Head:     Right side of head: No tonsillar adenopathy.     Left side of head: No tonsillar adenopathy.     Cervical: No cervical adenopathy.   Skin:    Findings: No erythema or rash.     Nails: There is no clubbing.  Neurological:     Mental Status: He is alert.     Diagnostics:    Spirometry was performed and demonstrated an FEV1 of 2.54 at 96 % of predicted.   Assessment and Plan:   1. Asthma, moderate persistent, well-controlled   2. Moderate persistent asthma with acute exacerbation   3. Other allergic rhinitis   4. LPRD (laryngopharyngeal reflux disease)     1. Continue reflux therapy:   A.  Omeprazole 40 mg - 1 tablet in AM  B.  Famotidine 40 mg - 1 tablet in PM  C.  Use a plan to decrease weight  2. Continue anti-inflammatory therapy for chest:    A. Qvar 80 2 inhalations two times per day    B. montelukast 10 mg daily  3. Continue anti-inflammatory therapy for nose:   A. Afrin - 1 spray in single nostril in evening. Alternate nostrils  B. flonase - 1 spray in both nostrils in evening  C. Astelin - 1 spray in both nostril in evening  D. flonase + astelin - 1 spray each in both nostrils in morning  4. Treat nasal infection:   A. Bactroban - place in both nostrils 3 times a day for 10 days  5. If Needed:   A. Proventil HFA or Xopenex HFA if needed  B. Zyrtec one tablet one time per day   C. nasal saline multiple times a day   D. Mucinex DM 2 tablets twice a day   6. "Action Plan" for flare up:   A. Increase Qvar to 3 inhalations 3 times per day  B. Use Albuterol HFA if needed  7.  Blood -TSH, free T4, thyroid peroxidase antibody, CBC with differential  8. Return in 6 months or earlier if problem  I do not really think that Shann has a sinus infection but I do think that his upper airway is extremely congested and he may have some form of superficial infectious rhinitis.  I am going to treat him with a low dose of Afrin utilizing a single nostril alternating nostrils every evening while we continue to have him use Flonase and Astelin and I am going to give him 10 days of topical Bactroban in  his nasal airway.  The question of why he is so congested is not clear and I think we do need to check his thyroid function tests to see if he is having hypothyroidism contributing to this issue and also check a CBC with differential to see if there is significant polycythemia.  He will contact me in 2 weeks noting his response to this approach.  Laurette Schimke, MD Allergy / Immunology [facility]

## 2020-05-24 ENCOUNTER — Telehealth: Payer: Self-pay

## 2020-05-24 DIAGNOSIS — D751 Secondary polycythemia: Secondary | ICD-10-CM

## 2020-05-24 DIAGNOSIS — E039 Hypothyroidism, unspecified: Secondary | ICD-10-CM

## 2020-05-24 NOTE — Telephone Encounter (Signed)
Called and informed patient of blood test order.  He is planning to go today to [facility]. Epic order sent to [facility].

## 2020-05-24 NOTE — Telephone Encounter (Signed)
-----   Message from Jessica Priest, MD sent at 05/23/2020  5:13 PM EST ----- Please have [facility] obtain a CBC with differential and a TSH, free T4, and thyroid peroxidase antibody.  I think it would be best to have these test drawn rather than waiting for the test results from [place] to show up.

## 2020-05-25 LAB — CBC WITH DIFFERENTIAL/PLATELET
Basophils Absolute: 0.1 10*3/uL (ref 0.0–0.2)
Basos: 1 %
EOS (ABSOLUTE): 0.3 10*3/uL (ref 0.0–0.4)
Eos: 4 %
Hematocrit: 43.5 % (ref 37.5–51.0)
Hemoglobin: 14.2 g/dL (ref 13.0–17.7)
Immature Grans (Abs): 0 10*3/uL (ref 0.0–0.1)
Immature Granulocytes: 0 %
Lymphocytes Absolute: 2.1 10*3/uL (ref 0.7–3.1)
Lymphs: 28 %
MCH: 29.8 pg (ref 26.6–33.0)
MCHC: 32.6 g/dL (ref 31.5–35.7)
MCV: 91 fL (ref 79–97)
Monocytes Absolute: 0.7 10*3/uL (ref 0.1–0.9)
Monocytes: 9 %
Neutrophils Absolute: 4.1 10*3/uL (ref 1.4–7.0)
Neutrophils: 58 %
Platelets: 215 10*3/uL (ref 150–450)
RBC: 4.76 x10E6/uL (ref 4.14–5.80)
RDW: 12.8 % (ref 11.6–15.4)
WBC: 7.2 10*3/uL (ref 3.4–10.8)

## 2020-05-25 LAB — THYROID PEROXIDASE ANTIBODY: Thyroperoxidase Ab SerPl-aCnc: <8 IU/mL (ref 0–34)

## 2020-05-25 LAB — TSH+FREE T4
Free T4: 1.09 ng/dL (ref 0.82–1.77)
TSH: 2.91 u[IU]/mL (ref 0.450–4.500)

## 2020-05-27 ENCOUNTER — Encounter: Payer: Self-pay | Admitting: Allergy and Immunology

## 2020-11-18 ENCOUNTER — Other Ambulatory Visit: Payer: Self-pay

## 2020-11-18 ENCOUNTER — Encounter: Payer: Self-pay | Admitting: Allergy and Immunology

## 2020-11-18 ENCOUNTER — Ambulatory Visit: Payer: Medicare PPO | Admitting: Allergy and Immunology

## 2020-11-18 VITALS — BP 122/64 | HR 61 | Temp 97.9°F | Resp 20 | Ht 67.5 in | Wt 216.0 lb

## 2020-11-18 DIAGNOSIS — J4540 Moderate persistent asthma, uncomplicated: Secondary | ICD-10-CM | POA: Diagnosis not present

## 2020-11-18 DIAGNOSIS — J3089 Other allergic rhinitis: Secondary | ICD-10-CM | POA: Diagnosis not present

## 2020-11-18 DIAGNOSIS — K219 Gastro-esophageal reflux disease without esophagitis: Secondary | ICD-10-CM

## 2020-11-18 NOTE — Patient Instructions (Signed)
  1. Continue reflux therapy:   A.  Omeprazole 40 mg - 1 tablet in AM  B.  Famotidine 40 mg - 1 tablet in PM  C.  Use a plan to decrease weight  2. Continue anti-inflammatory therapy for chest:    A. Qvar 80 2 inhalations two times per day    B. montelukast 10 mg daily  3. Continue anti-inflammatory therapy for nose:   A. Afrin - 1 spray in single nostril in evening. Alternate nostrils  B. flonase - 1 spray in both nostrils in evening  C. Astelin - 1 spray in both nostril in evening  D. flonase + astelin - 1 spray each in both nostrils in morning  4. If Needed:   A. Proventil HFA or Xopenex HFA if needed  B. Zyrtec one tablet one time per day   C. nasal saline multiple times a day   D. Mucinex DM 2 tablets twice a day   5. "Action Plan" for flare up:   A. Increase Qvar to 3 inhalations 3 times per day  B. Use Albuterol HFA if needed  7.  Return in 6 months or earlier if problem

## 2020-11-18 NOTE — Progress Notes (Signed)
[place] - [place] - [place] - [name] - [place]   Follow-up Note  Referring Provider: Stormy Fabian, NP Primary Provider: Stormy Fabian, NP Date of Office Visit: 11/18/2020  Subjective:   Bob Newman (DOB: [birth year]) is a 75 y.o. male who [facility]2020 in re-evaluation of the following:  HPI: [name] returns to this clinic in reevaluation of asthma and allergic rhinitis and LPR.  His last visit to this clinic was 23 May 2020.  His lower airway issue is under excellent control and has not required a systemic steroid to treat an exacerbation and he can exert himself to any extent and he rarely uses a short acting bronchodilator.  He continues to use Qvar on a consistent basis.  His nose has been doing relatively well.  He has a combination of Afrin/Flonase/Astelin that he uses every evening but in reality he is only using Afrin intermittently.  He always will use Flonase and azelastine if he uses his Afrin.  He continues to use his Flonase and azelastine in the morning.  He has not required a antibiotic to treat an episode of sinusitis.  When he was last seen in this clinic he did have a lot of nasal burning and we gave him topical Bactroban and that appeared to resolve that issue.  His reflux has been under very good control.  During his last visit we did investigate a "thyroid issue" that he was told that he had by his primary care doctor.  All of his thyroid test came back normal. He has an appointment with his nephrologist on 01 January 2021 regarding his fluctuating creatinine level.  Allergies as of 11/18/2020       Reactions   Latex Shortness Of Breath   Levaquin [levofloxacin] Hives   Miralax [polyethylene Glycol] Hives   Spiriva Handihaler [tiotropium Bromide Monohydrate] Hives        Medication List       amLODipine 5 MG tablet Commonly known as: NORVASC 5 mg daily.   azelastine 0.1 % nasal  spray Commonly known as: ASTELIN Place 2 sprays into both nostrils 2 (two) times daily.   azelastine 0.1 % nasal spray Commonly known as: ASTELIN 1 spray per nostril in both nostrils in the morning and the evening.   candesartan 32 MG tablet Commonly known as: ATACAND Take 32 mg by mouth daily.   Crestor 20 MG tablet Generic drug: rosuvastatin Take 20 mg by mouth daily.   dronedarone 400 MG tablet Commonly known as: MULTAQ Take 400 mg by mouth 2 (two) times daily with a meal.   Eliquis 5 MG Tabs tablet Generic drug: apixaban   famotidine 40 MG tablet Commonly known as: PEPCID Take 1 tablet (40 mg total) by mouth 2 (two) times daily.   ferrous sulfate 325 (65 FE) MG tablet Take by mouth.   fluticasone 50 MCG/ACT nasal spray Commonly known as: FLONASE One spray in both nostrils in the morning and the evening   IRON PO Take by mouth.   levalbuterol 45 MCG/ACT inhaler Commonly known as: Xopenex HFA Inhale two puffs every 4-6 hours if needed for cough/wheeze/shortness of breath.   metoprolol succinate 50 MG 24 hr tablet Commonly known as: TOPROL-XL Take 25 mg by mouth daily.   montelukast 10 MG tablet Commonly known as: SINGULAIR TAKE ONE TABLET ONCE DAILY   nitroGLYCERIN 0.4 MG SL tablet Commonly known as: NITROSTAT   omeprazole 40 MG capsule Commonly known as:  PRILOSEC Take 1 capsule (40 mg total) by mouth daily.   Qvar RediHaler 80 MCG/ACT inhaler Generic drug: beclomethasone Inhale two doses twice daily to prevent cough or wheeze.  Rinse, gargle, and spit after use.   vitamin B-12 500 MCG tablet Commonly known as: CYANOCOBALAMIN Take 500 mcg by mouth daily.   VITAMIN D PO Take by mouth daily.        Past Medical History:  Diagnosis Date   Allergic rhinoconjunctivitis    Asthma    Atrial fibrillation (HCC)    GERD (gastroesophageal reflux disease)    High blood pressure     Past Surgical History:  Procedure Laterality Date    CHOLECYSTECTOMY     THROMBOENDARTERECTOMY Right 09/22/2016   Boykin Reaper, MD at [facility]   TONSILLECTOMY      Review of systems negative except as noted in HPI / PMHx or noted below:  Review of Systems  Constitutional: Negative.   HENT: Negative.    Eyes: Negative.   Respiratory: Negative.    Cardiovascular: Negative.   Gastrointestinal: Negative.   Genitourinary: Negative.   Musculoskeletal: Negative.   Skin: Negative.   Neurological: Negative.   Endo/Heme/Allergies: Negative.   Psychiatric/Behavioral: Negative.      Objective:   Vitals:   11/18/20 1044  BP: 122/64  Pulse: 61  Resp: 20  Temp: 97.9 F (36.6 C)  SpO2: 96%   Height: 5' 7.5" (171.5 cm)  Weight: 216 lb (98 kg)   Physical Exam Constitutional:      Appearance: He is not diaphoretic.  HENT:     Head: Normocephalic.     Right Ear: Tympanic membrane, ear canal and external ear normal.     Left Ear: Tympanic membrane, ear canal and external ear normal.     Nose: Septal deviation and mucosal edema present. No rhinorrhea.     Mouth/Throat:     Pharynx: Uvula midline. No oropharyngeal exudate.  Eyes:     Conjunctiva/sclera: Conjunctivae normal.  Neck:     Thyroid: No thyromegaly.     Trachea: Trachea normal. No tracheal tenderness or tracheal deviation.  Cardiovascular:     Rate and Rhythm: Normal rate and regular rhythm.     Heart sounds: Normal heart sounds, S1 normal and S2 normal. No murmur heard. Pulmonary:     Effort: No respiratory distress.     Breath sounds: Normal breath sounds. No stridor. No wheezing or rales.  Lymphadenopathy:     Head:     Right side of head: No tonsillar adenopathy.     Left side of head: No tonsillar adenopathy.     Cervical: No cervical adenopathy.  Skin:    Findings: No erythema or rash.     Nails: There is no clubbing.  Neurological:     Mental Status: He is alert.    Diagnostics:    Spirometry was performed and demonstrated an FEV1 of 2.17 at  81 % of predicted.  Results of blood tests obtained 24 May 2020 identified WBC 7.2, absolute eosinophil 300, absolute basophil 100, absolute lymphocyte 2100, hemoglobin 14.2, platelet 215, TSH 2.910 IU/mL, free T4 1.09 NG/DL, thyroid peroxidase antibody less than 8 EU/mL.  Assessment and Plan:   1. Asthma, moderate persistent, well-controlled   2. Other allergic rhinitis   3. LPRD (laryngopharyngeal reflux disease)     1. Continue reflux therapy:   A.  Omeprazole 40 mg - 1 tablet in AM  B.  Famotidine 40 mg - 1 tablet in PM  C.  Use a plan to decrease weight  2. Continue anti-inflammatory therapy for chest:    A. Qvar 80 2 inhalations two times per day    B. montelukast 10 mg daily  3. Continue anti-inflammatory therapy for nose:   A. Afrin - 1 spray in single nostril in evening. Alternate nostrils  B. flonase - 1 spray in both nostrils in evening  C. Astelin - 1 spray in both nostril in evening  D. flonase + astelin - 1 spray each in both nostrils in morning  4. If Needed:   A. Proventil HFA or Xopenex HFA if needed  B. Zyrtec one tablet one time per day   C. nasal saline multiple times a day   D. Mucinex DM 2 tablets twice a day   5. "Action Plan" for flare up:   A. Increase Qvar to 3 inhalations 3 times per day  B. Use Albuterol HFA if needed  7.  Return in 6 months or earlier if problem  Eulogio will continue to utilize the plan noted above which includes anti-inflammatory agents for his airway and therapy directed against reflux and I will see him back in this clinic in 6 months or earlier if there is a problem.  Laurette Schimke, MD Allergy / Immunology [facility]

## 2020-11-19 ENCOUNTER — Encounter: Payer: Self-pay | Admitting: Allergy and Immunology

## 2020-12-03 ENCOUNTER — Other Ambulatory Visit: Payer: Self-pay | Admitting: Allergy and Immunology

## 2021-04-10 IMAGING — US US RENAL
1 series · 14 of 25 positions shown · non-contrast
Comparison: None.

CLINICAL DATA: Chronic kidney disease, hypertension

EXAM:
RENAL / URINARY TRACT ULTRASOUND COMPLETE

[Series 1: us renal · 0.25mm/px · 14 of 39 slices shown]
[im 1/39]
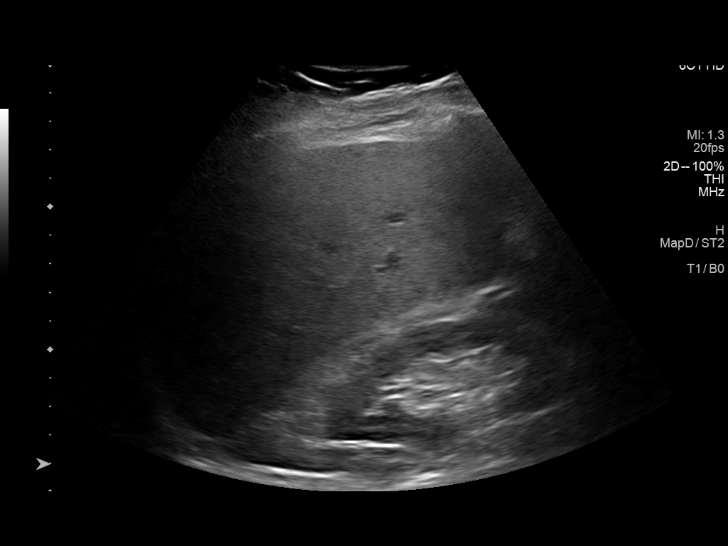
[im 4/39]
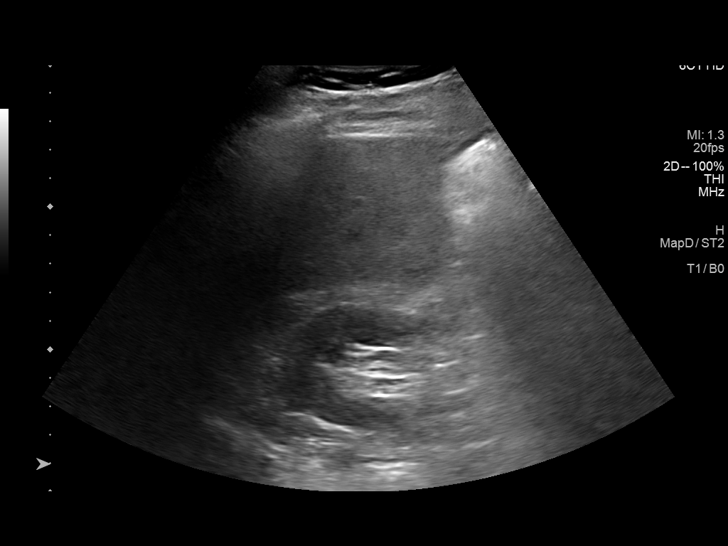
[im 7/39]
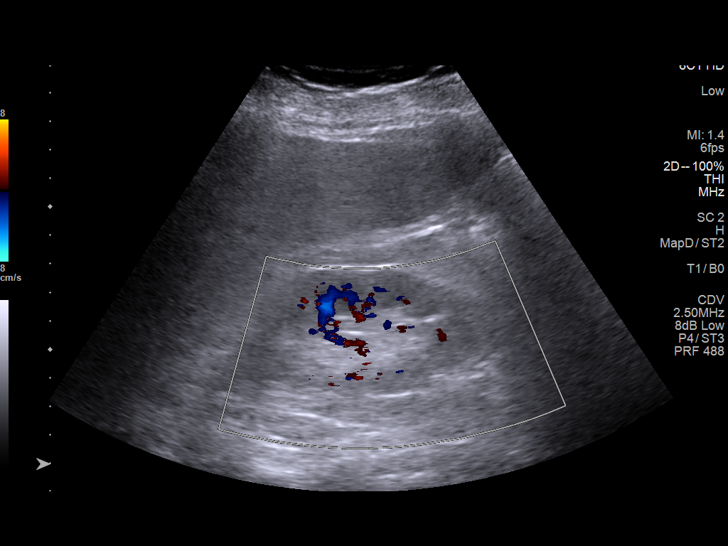
[im 10/39]
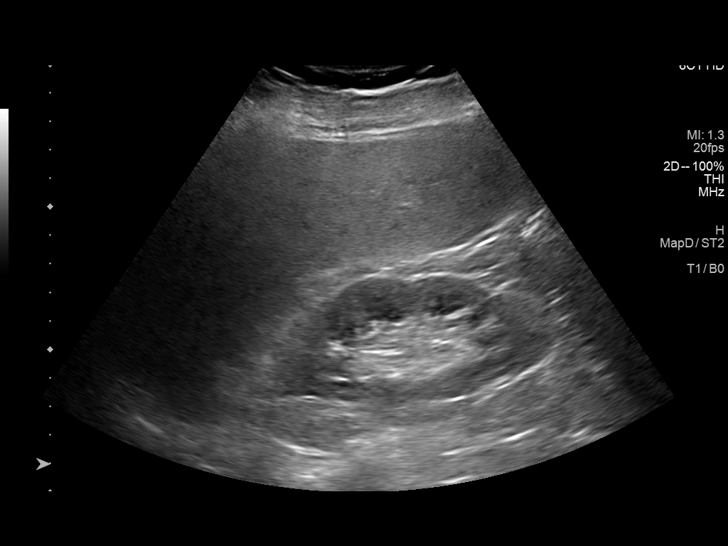
[im 13/39]
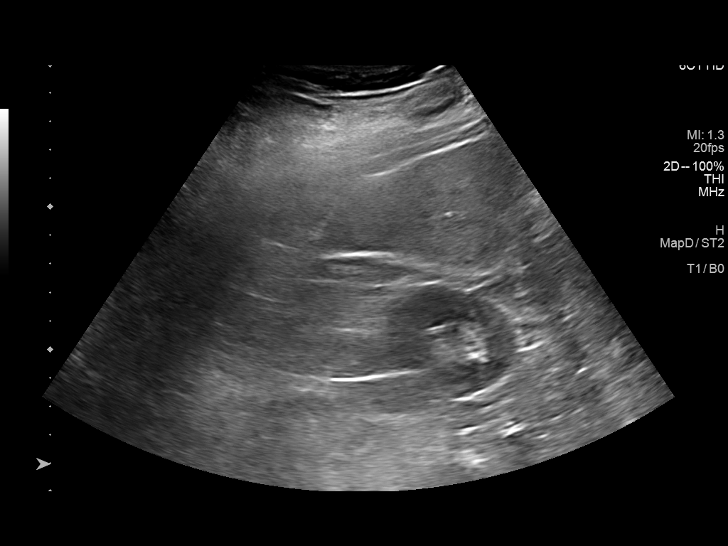
[im 15/39]
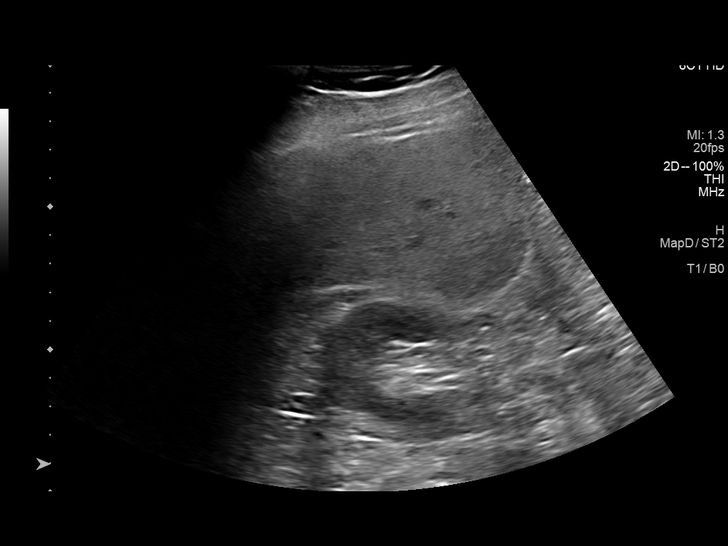
[im 18/39]
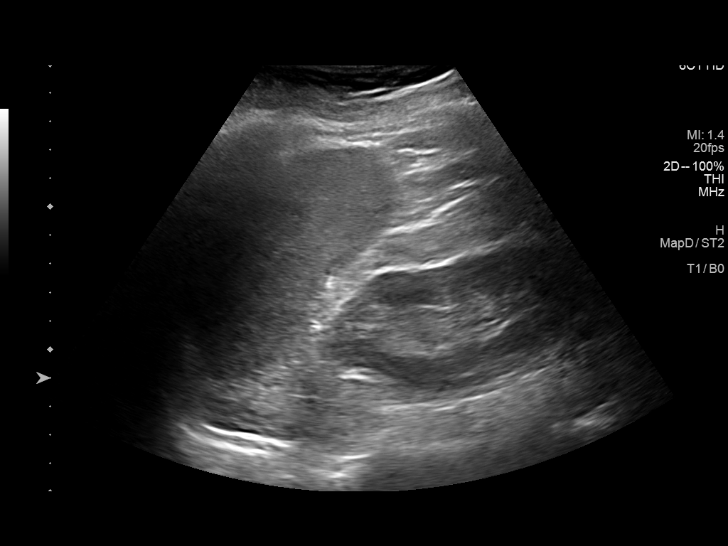
[im 21/39]
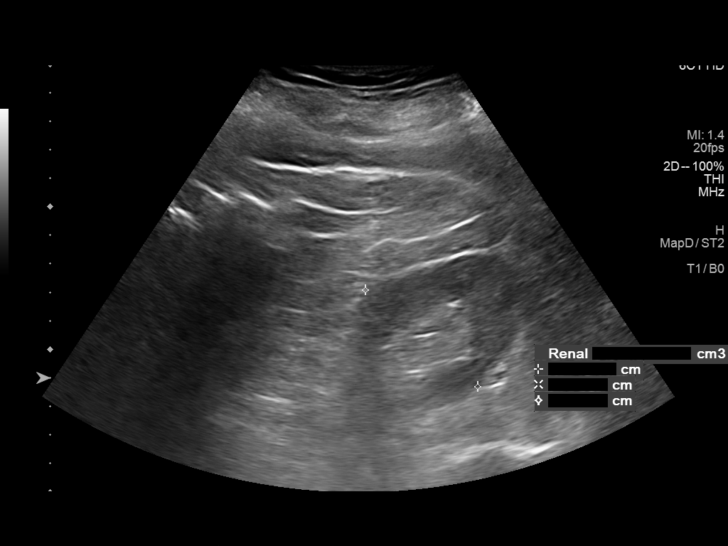
[im 24/39]
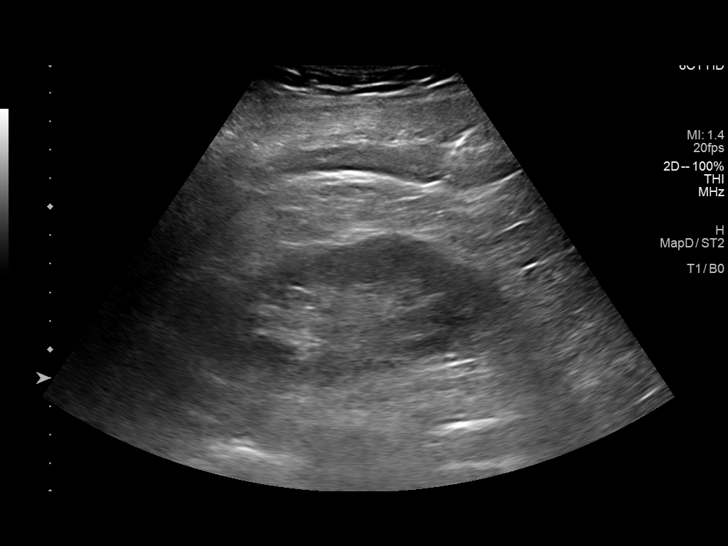
[im 26/39]
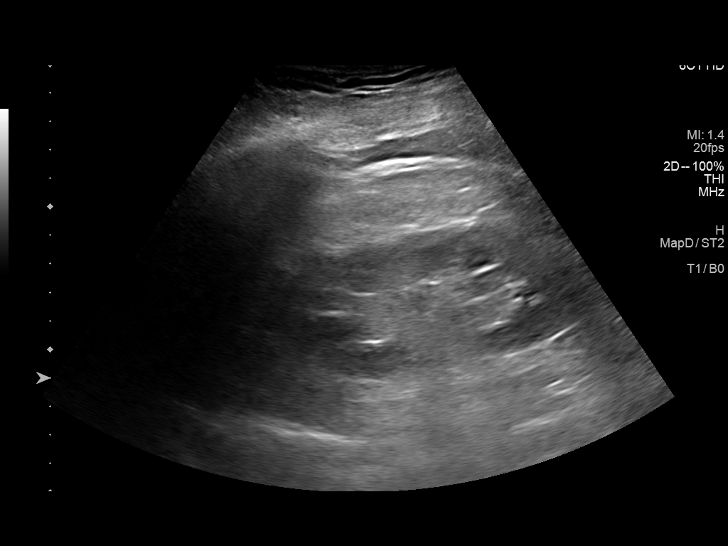
[im 29/39]
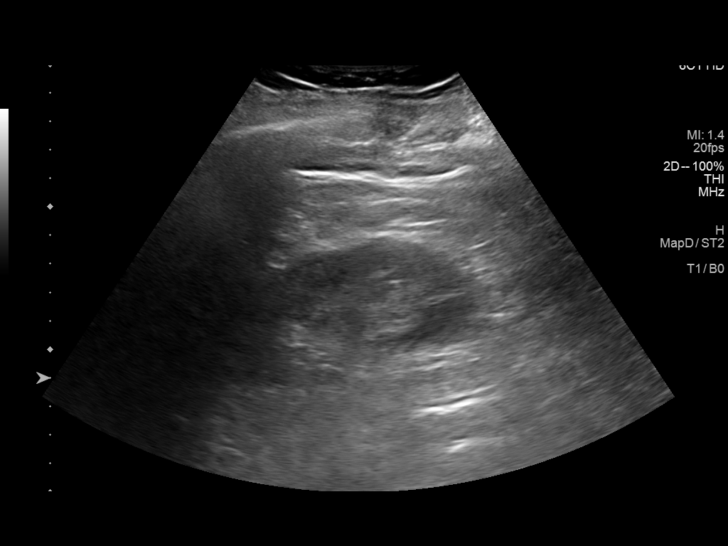
[im 32/39]
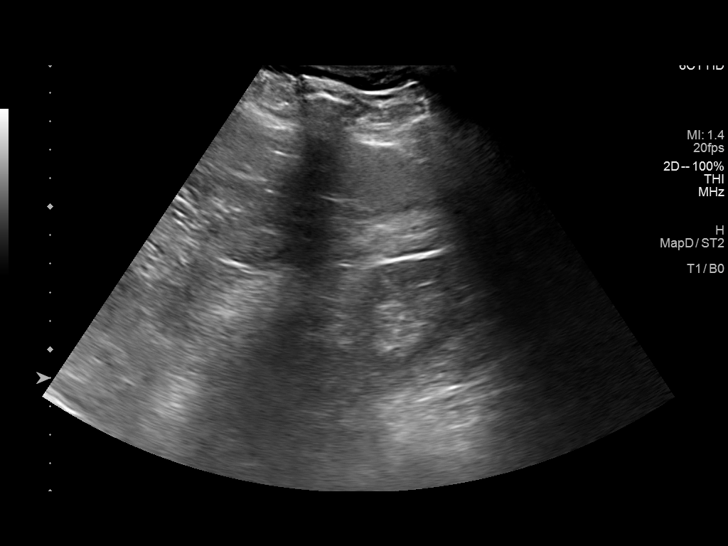
[im 35/39]
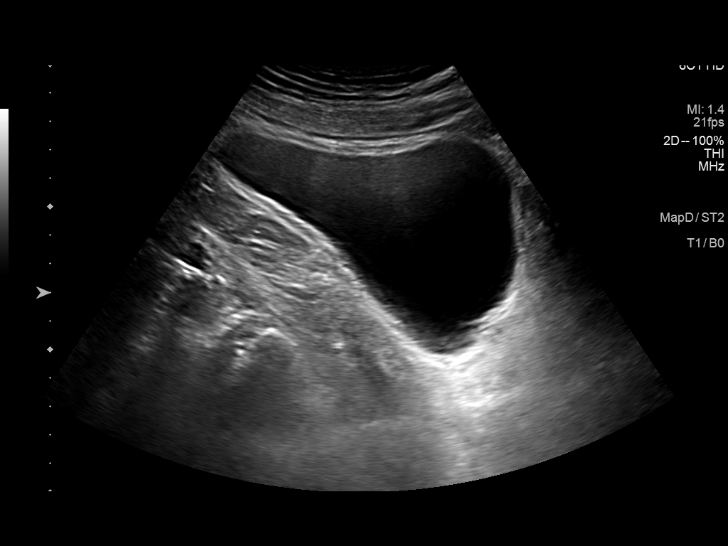
[im 39/39]
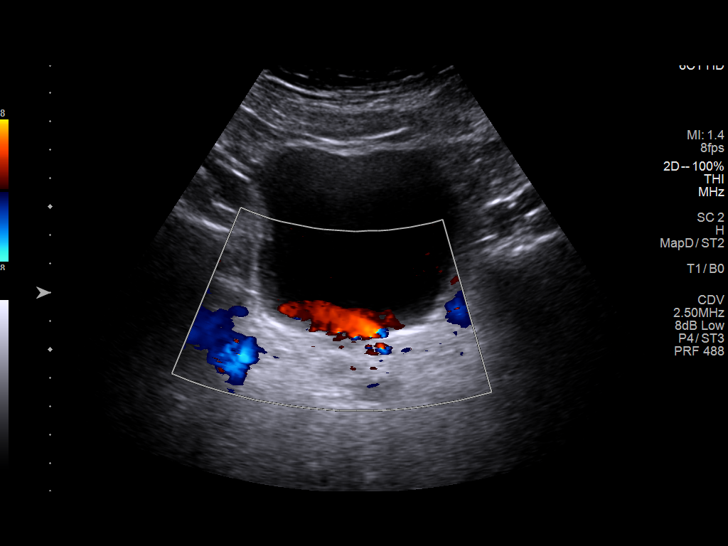

[14 of 25 positions shown; findings below may reference images not displayed]

FINDINGS: Right Kidney:

Renal measurements: 10.6 x 4.3 x 4.4 cm = volume: 109 mL.
Echogenicity within normal limits. No mass or hydronephrosis
visualized.

Left Kidney:

Renal measurements: 11.6 x 4.8 x 5.2 cm = volume: 150 mL.
Echogenicity within normal limits. No mass or hydronephrosis
visualized.

Bladder:

Appears normal for degree of bladder distention. Bilateral ureteral
jets are incidentally noted.

Other:

None.
IMPRESSION: Normal renal ultrasound.

## 2021-05-21 ENCOUNTER — Encounter: Payer: Self-pay | Admitting: Allergy and Immunology

## 2021-05-21 ENCOUNTER — Other Ambulatory Visit: Payer: Self-pay

## 2021-05-21 ENCOUNTER — Ambulatory Visit: Payer: Medicare PPO | Admitting: Allergy and Immunology

## 2021-05-21 VITALS — BP 130/82 | HR 63 | Resp 16 | Ht 67.0 in | Wt 222.0 lb

## 2021-05-21 DIAGNOSIS — M19049 Primary osteoarthritis, unspecified hand: Secondary | ICD-10-CM | POA: Diagnosis not present

## 2021-05-21 DIAGNOSIS — J3089 Other allergic rhinitis: Secondary | ICD-10-CM

## 2021-05-21 DIAGNOSIS — K219 Gastro-esophageal reflux disease without esophagitis: Secondary | ICD-10-CM | POA: Diagnosis not present

## 2021-05-21 DIAGNOSIS — J4540 Moderate persistent asthma, uncomplicated: Secondary | ICD-10-CM | POA: Diagnosis not present

## 2021-05-21 MED ORDER — FAMOTIDINE 40 MG PO TABS
40.0000 mg | ORAL_TABLET | Freq: Every day | ORAL | 1 refills | Status: DC
Start: 2021-05-21 — End: 2021-11-19

## 2021-05-21 MED ORDER — QVAR REDIHALER 80 MCG/ACT IN AERB: INHALATION_SPRAY | RESPIRATORY_TRACT | 1 refills | Status: DC

## 2021-05-21 MED ORDER — MONTELUKAST SODIUM 10 MG PO TABS: ORAL_TABLET | ORAL | 1 refills | Status: DC

## 2021-05-21 MED ORDER — LEVALBUTEROL TARTRATE 45 MCG/ACT IN AERO: INHALATION_SPRAY | RESPIRATORY_TRACT | 0 refills | Status: DC

## 2021-05-21 MED ORDER — AZELASTINE HCL 0.1 % NA SOLN: NASAL | 1 refills | Status: DC

## 2021-05-21 MED ORDER — FLUTICASONE PROPIONATE 50 MCG/ACT NA SUSP: NASAL | 1 refills | Status: DC

## 2021-05-21 MED ORDER — OMEPRAZOLE 40 MG PO CPDR: DELAYED_RELEASE_CAPSULE | ORAL | 1 refills | Status: DC

## 2021-05-21 NOTE — Progress Notes (Signed)
[place] - [place] - [place] - [name] - [place]   Follow-up Note  Referring Provider: Stormy Fabian, NP Primary Provider: Stormy Fabian, NP Date of Office Visit: 05/21/2021  Subjective:   Bob Newman (DOB: 31-Oct-1945) is a 75 y.o. male who [facility] in re-evaluation of the following:  HPI: Bob Newman returns to this clinic in reevaluation of asthma and allergic rhinitis and LPR.  His last visit to this clinic was 18 November 2020.  Overall his airway issue has really been doing very well.  He has not required a systemic steroid or an antibiotic for any type of airway issue.  He rarely uses a short acting bronchodilator.  He can breathe through his nose without much difficulty on his current plan.  Currently he is using anti-inflammatory agents for his airway on a relatively consistent basis.  His reflux is under very good control with a combination of a proton pump inhibitor and H2 receptor blocker.  He has a few other issues he wants to discuss today.  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  His hand is somewhat stiff in the morning especially on the right.  His #4 knuckle is very tender and it hurts for him to close his hands.  Apparently this has been developing over the course of the past month.  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  As well, he has had some oscillating lower extremity edema.  This has been a longstanding issue.  He is on the amlodipine.  And, he has a history of renal dysfunction and he presents his laboratory test today from August 2022 which shows a creatinine of 1.5 mg/DL.  He has received 3 COVID vaccines and has had multiple exposures to COVID without developing an infection and has received the flu vaccine.  Allergies as of 05/21/2021       Reactions   Latex Shortness Of Breath   Levaquin [levofloxacin]  Hives   Miralax [polyethylene Glycol] Hives   Spiriva Handihaler [tiotropium Bromide Monohydrate] Hives        Medication List    amLODipine 5 MG tablet Commonly known as: NORVASC 5 mg daily.   azelastine 0.1 % nasal spray Commonly known as: ASTELIN 1 spray per nostril in both nostrils in the morning and the evening.   candesartan 32 MG tablet Commonly known as: ATACAND Take 32 mg by mouth daily.   Crestor 20 MG tablet Generic drug: rosuvastatin Take 20 mg by mouth daily.   dronedarone 400 MG tablet Commonly known as: MULTAQ Take 400 mg by mouth 2 (two) times daily with a meal.   Eliquis 5 MG Tabs tablet Generic drug: apixaban   famotidine 40 MG tablet Commonly known as: PEPCID Take 1 tablet (40 mg total) by mouth 2 (two) times daily.   ferrous sulfate 325 (65 FE) MG tablet Take by mouth.   fluticasone 50 MCG/ACT nasal spray Commonly known as: FLONASE One spray in both nostrils in the morning and the evening   IRON PO Take by mouth.   metoprolol succinate 50 MG 24 hr tablet Commonly known as: TOPROL-XL Take 25 mg by mouth daily.   montelukast 10 MG tablet Commonly known as: SINGULAIR TAKE ONE TABLET ONCE DAILY   nitroGLYCERIN 0.4 MG SL tablet Commonly known as: NITROSTAT   omeprazole 40 MG capsule Commonly known as:  PRILOSEC TAKE ONE CAPSULE (40 MG TOTAL) BY MOUTH DAILY.   Qvar RediHaler 80 MCG/ACT inhaler Generic drug: beclomethasone Inhale two doses twice daily to prevent cough or wheeze.  Rinse, gargle, and spit after use.    Past Medical History:  Diagnosis Date   Allergic rhinoconjunctivitis    Asthma    Atrial fibrillation (HCC)    GERD (gastroesophageal reflux disease)    High blood pressure     Past Surgical History:  Procedure Laterality Date   CHOLECYSTECTOMY     THROMBOENDARTERECTOMY Right 09/22/2016   Boykin Reaper, MD at [facility]   TONSILLECTOMY      Review of systems negative except as noted in HPI / PMHx or  noted below:  Review of Systems  Constitutional: Negative.   HENT: Negative.    Eyes: Negative.   Respiratory: Negative.    Cardiovascular: Negative.   Gastrointestinal: Negative.   Genitourinary: Negative.   Musculoskeletal: Negative.   Skin: Negative.   Neurological: Negative.   Endo/Heme/Allergies: Negative.   Psychiatric/Behavioral: Negative.      Objective:   Vitals:   05/21/21 1108  BP: 130/82  Pulse: 63  Resp: 16  SpO2: 94%   Height: 5\' 7"  (170.2 cm)  Weight: 222 lb (100.7 kg)   Physical Exam Constitutional:      Appearance: He is not diaphoretic.  HENT:     Head: Normocephalic.     Right Ear: Tympanic membrane, ear canal and external ear normal.     Left Ear: Tympanic membrane, ear canal and external ear normal.     Nose: Nose normal. No mucosal edema or rhinorrhea.     Mouth/Throat:     Pharynx: Uvula midline. No oropharyngeal exudate.  Eyes:     Conjunctiva/sclera: Conjunctivae normal.  Neck:     Thyroid: No thyromegaly.     Trachea: Trachea normal. No tracheal tenderness or tracheal deviation.  Cardiovascular:     Rate and Rhythm: Normal rate and regular rhythm.     Heart sounds: Normal heart sounds, S1 normal and S2 normal. No murmur heard. Pulmonary:     Effort: No respiratory distress.     Breath sounds: Normal breath sounds. No stridor. No wheezing or rales.  Lymphadenopathy:     Head:     Right side of head: No tonsillar adenopathy.     Left side of head: No tonsillar adenopathy.     Cervical: No cervical adenopathy.  Skin:    Findings: No erythema or rash.     Nails: There is no clubbing.  Neurological:     Mental Status: He is alert.    Diagnostics:    Spirometry was performed and demonstrated an FEV1 of 1.88 at 70 % of predicted.  Assessment and Plan:   1. Asthma, moderate persistent, well-controlled   2. Other allergic rhinitis   3. LPRD (laryngopharyngeal reflux disease)   4. Hand arthritis     1. Continue reflux  therapy:   A.  Omeprazole 40 mg - 1 tablet in AM  B.  Famotidine 40 mg - 1 tablet in PM  2. Continue anti-inflammatory therapy for chest:    A. Qvar 80 -2 inhalations two times per day    B. montelukast 10 mg daily  3. Continue anti-inflammatory therapy for nose:   A. Afrin - 1 spray in single nostril in evening. Alternate nostrils  B. flonase + astelin - 1 spray each in both nostrils in evening  D. flonase + astelin - 1 spray each  in both nostrils in morning  4. If Needed:   A. Proventil HFA or Xopenex HFA if needed  B. Zyrtec one tablet one time per day   C. nasal saline multiple times a day   D. Mucinex DM 2 tablets twice a day   5. "Action Plan" for flare up:   A. Increase Qvar to 3 inhalations 3 times per day  B. Use Albuterol HFA if needed  6. May need evaluation for hand arthritis if problems persist  7.  Creatinine = 1.5 which is very good.   8.  Return in 6 months or earlier if problem  Creed appears to be doing very well regarding his airway issue and his reflux issue and we are not going to  change any of his treatment at this point time and will continue on a collection of anti-inflammatory agents for his airway and a combination of a proton pump inhibitor and H2 receptor blocker.  There are several other medications that he can use should they be required as noted above.  He has developed some hand arthritis and I have informed him that this is probably a traumatic form of arthritis from his use of a hammer hitting metal and if this does not resolve then he may require further evaluation for hand arthritis.  As well, I had a talk with him today about his creatinine 1.5 obtained in August 2022 which is very good in the setting of his history of renal dysfunction.  I will see him back in his clinic in 6 months or earlier if there is a problem.  Laurette Schimke, MD Allergy / Immunology [facility]

## 2021-05-21 NOTE — Patient Instructions (Addendum)
°  1. Continue reflux therapy:   A.  Omeprazole 40 mg - 1 tablet in AM  B.  Famotidine 40 mg - 1 tablet in PM  2. Continue anti-inflammatory therapy for chest:    A. Qvar 80 -2 inhalations two times per day    B. montelukast 10 mg daily  3. Continue anti-inflammatory therapy for nose:   A. Afrin - 1 spray in single nostril in evening. Alternate nostrils  B. flonase + astelin - 1 spray each in both nostrils in evening  D. flonase + astelin - 1 spray each in both nostrils in morning  4. If Needed:   A. Proventil HFA or Xopenex HFA if needed  B. Zyrtec one tablet one time per day   C. nasal saline multiple times a day   D. Mucinex DM 2 tablets twice a day   5. "Action Plan" for flare up:   A. Increase Qvar to 3 inhalations 3 times per day  B. Use Albuterol HFA if needed  6. May need evaluation for hand arthritis if problems persist  7.  Creatinine = 1.5 which is very good.   8.  Return in 6 months or earlier if problem

## 2021-05-22 ENCOUNTER — Encounter: Payer: Self-pay | Admitting: Allergy and Immunology

## 2021-10-14 ENCOUNTER — Ambulatory Visit: Payer: Medicare Other | Attending: Family | Admitting: Family

## 2021-10-14 ENCOUNTER — Ambulatory Visit (RURAL_HEALTH_CENTER): Payer: Medicare Other | Attending: Family | Admitting: Family

## 2021-10-14 ENCOUNTER — Other Ambulatory Visit: Payer: Self-pay

## 2021-10-14 ENCOUNTER — Encounter (RURAL_HEALTH_CENTER): Payer: Self-pay | Admitting: Family

## 2021-10-14 VITALS — BP 138/58 | HR 80 | Temp 98.3°F | Resp 18 | Ht 67.0 in | Wt 216.0 lb

## 2021-10-14 DIAGNOSIS — N183 Chronic kidney disease, stage 3 unspecified: Secondary | ICD-10-CM | POA: Insufficient documentation

## 2021-10-14 DIAGNOSIS — E559 Vitamin D deficiency, unspecified: Secondary | ICD-10-CM | POA: Insufficient documentation

## 2021-10-14 DIAGNOSIS — E669 Obesity, unspecified: Secondary | ICD-10-CM | POA: Insufficient documentation

## 2021-10-14 DIAGNOSIS — K219 Gastro-esophageal reflux disease without esophagitis: Secondary | ICD-10-CM | POA: Insufficient documentation

## 2021-10-14 DIAGNOSIS — E782 Mixed hyperlipidemia: Secondary | ICD-10-CM | POA: Insufficient documentation

## 2021-10-14 DIAGNOSIS — J449 Chronic obstructive pulmonary disease, unspecified: Secondary | ICD-10-CM | POA: Insufficient documentation

## 2021-10-14 DIAGNOSIS — E538 Deficiency of other specified B group vitamins: Secondary | ICD-10-CM | POA: Insufficient documentation

## 2021-10-14 DIAGNOSIS — I129 Hypertensive chronic kidney disease with stage 1 through stage 4 chronic kidney disease, or unspecified chronic kidney disease: Secondary | ICD-10-CM | POA: Insufficient documentation

## 2021-10-14 DIAGNOSIS — I10 Essential (primary) hypertension: Secondary | ICD-10-CM | POA: Insufficient documentation

## 2021-10-14 DIAGNOSIS — I480 Paroxysmal atrial fibrillation: Secondary | ICD-10-CM | POA: Insufficient documentation

## 2021-10-14 DIAGNOSIS — E1122 Type 2 diabetes mellitus with diabetic chronic kidney disease: Secondary | ICD-10-CM | POA: Insufficient documentation

## 2021-10-14 DIAGNOSIS — Z7901 Long term (current) use of anticoagulants: Secondary | ICD-10-CM | POA: Insufficient documentation

## 2021-10-14 DIAGNOSIS — F17210 Nicotine dependence, cigarettes, uncomplicated: Secondary | ICD-10-CM | POA: Insufficient documentation

## 2021-10-14 DIAGNOSIS — Z6833 Body mass index (BMI) 33.0-33.9, adult: Secondary | ICD-10-CM | POA: Insufficient documentation

## 2021-10-14 DIAGNOSIS — E66811 Obesity, class 1: Secondary | ICD-10-CM | POA: Insufficient documentation

## 2021-10-14 LAB — LIPID PANEL
CHOL/HDL RATIO: 2.6
CHOLESTEROL: 105 mg/dL (ref <200)
HDL CHOL: 40 mg/dL (ref 23–92)
LDL CALC: 44 mg/dL (ref 0–100)
TRIGLYCERIDES: 107 mg/dL (ref <=150)
VLDL CALC: 21 mg/dL (ref 0–50)

## 2021-10-14 LAB — CBC
HCT: 39.5 % — ABNORMAL LOW (ref 42.0–51.0)
HGB: 13.4 g/dL — ABNORMAL LOW (ref 13.5–18.0)
MCH: 29.2 pg (ref 27.0–32.0)
MCHC: 33.8 g/dL (ref 32.0–36.0)
MCV: 86.5 fL (ref 78.0–99.0)
MPV: 8.9 fL (ref 7.4–10.4)
PLATELETS: 323 10*3/uL (ref 140–440)
RBC: 4.57 10*6/uL (ref 4.20–6.00)
RDW: 14 % (ref 11.6–14.8)
WBC: 12.4 10*3/uL — ABNORMAL HIGH (ref 4.0–10.5)
WBCS UNCORRECTED: 12.4 10*3/uL

## 2021-10-14 LAB — COMPREHENSIVE METABOLIC PNL, FASTING
ALBUMIN/GLOBULIN RATIO: 1.4 (ref 0.8–1.4)
ALBUMIN: 4.4 g/dL (ref 3.5–5.7)
ALKALINE PHOSPHATASE: 54 U/L (ref 34–104)
ALT (SGPT): 29 U/L (ref 7–52)
ANION GAP: 6 mmol/L — ABNORMAL LOW (ref 10–20)
AST (SGOT): 23 U/L (ref 13–39)
BILIRUBIN TOTAL: 0.6 mg/dL (ref 0.3–1.2)
BUN/CREA RATIO: 12 (ref 6–22)
BUN: 21 mg/dL (ref 7–25)
CALCIUM, CORRECTED: 9.8 mg/dL (ref 8.9–10.8)
CALCIUM: 10.2 mg/dL (ref 8.6–10.3)
CHLORIDE: 101 mmol/L (ref 98–107)
CO2 TOTAL: 25 mmol/L (ref 21–31)
CREATININE: 1.79 mg/dL — ABNORMAL HIGH (ref 0.60–1.30)
ESTIMATED GFR: 38 mL/min/{1.73_m2} — ABNORMAL LOW (ref >59)
GLOBULIN: 3.1 (ref 2.9–5.4)
GLUCOSE: 133 mg/dL — ABNORMAL HIGH (ref 74–109)
OSMOLALITY, CALCULATED: 270 mOsm/kg (ref 270–290)
POTASSIUM: 4.5 mmol/L (ref 3.5–5.1)
PROTEIN TOTAL: 7.5 g/dL (ref 6.4–8.9)
SODIUM: 132 mmol/L — ABNORMAL LOW (ref 136–145)

## 2021-10-14 LAB — MICROALBUMIN URINE, RANDOM: MICROALBUMIN RANDOM URINE: 3.7 mg/dL

## 2021-10-14 LAB — HGA1C (HEMOGLOBIN A1C WITH EST AVG GLUCOSE): HEMOGLOBIN A1C: 6.3 % — ABNORMAL HIGH (ref 4.0–6.0)

## 2021-10-14 LAB — VITAMIN B12: VITAMIN B 12: 268 pg/mL (ref 180–914)

## 2021-10-14 LAB — VITAMIN D 25 TOTAL: VITAMIN D: 35 ng/mL (ref 30–100)

## 2021-10-14 MED ORDER — FAMOTIDINE 40 MG TABLET
40.0000 mg | ORAL_TABLET | Freq: Every evening | ORAL | 1 refills | Status: AC
Start: 2021-10-14 — End: 2022-01-14

## 2021-10-14 MED ORDER — OMEPRAZOLE 40 MG CAPSULE,DELAYED RELEASE
40.0000 mg | DELAYED_RELEASE_CAPSULE | Freq: Every day | ORAL | 1 refills | Status: AC
Start: 2021-10-14 — End: 2022-01-14

## 2021-10-14 MED ORDER — MULTAQ 400 MG TABLET
400.0000 mg | ORAL_TABLET | Freq: Two times a day (BID) | ORAL | 1 refills | Status: AC
Start: 2021-10-14 — End: 2022-01-14

## 2021-10-14 MED ORDER — MONTELUKAST 10 MG TABLET
10.0000 mg | ORAL_TABLET | Freq: Every day | ORAL | 1 refills | Status: AC
Start: 2021-10-14 — End: ?

## 2021-10-14 MED ORDER — APIXABAN 5 MG TABLET
5.0000 mg | ORAL_TABLET | Freq: Two times a day (BID) | ORAL | 1 refills | Status: AC
Start: 2021-10-14 — End: 2022-01-14

## 2021-10-14 MED ORDER — AMLODIPINE 5 MG TABLET
5.0000 mg | ORAL_TABLET | Freq: Every day | ORAL | 1 refills | Status: AC
Start: 2021-10-14 — End: ?

## 2021-10-14 MED ORDER — METOPROLOL SUCCINATE ER 50 MG TABLET,EXTENDED RELEASE 24 HR
50.0000 mg | ORAL_TABLET | Freq: Every day | ORAL | 1 refills | Status: AC
Start: 2021-10-14 — End: ?

## 2021-10-14 MED ORDER — ROSUVASTATIN 20 MG TABLET
20.0000 mg | ORAL_TABLET | Freq: Every day | ORAL | 1 refills | Status: AC
Start: 2021-10-14 — End: ?

## 2021-10-14 MED ORDER — CANDESARTAN 32 MG TABLET
32.0000 mg | ORAL_TABLET | Freq: Every day | ORAL | 1 refills | Status: AC
Start: 2021-10-14 — End: 2022-01-14

## 2021-10-14 MED ORDER — AZELASTINE 137 MCG (0.1 %) NASAL SPRAY
2.0000 | Freq: Every day | NASAL | 1 refills | Status: AC
Start: 2021-10-14 — End: 2022-01-14

## 2021-10-14 NOTE — Progress Notes (Signed)
[facility], [facility]  [address]  [place] [id number]  Operated by [facility]     Name: Frank SatoHarry F Barton MRN:  [id number]   Date of Birth: [birth year] Age: 76 y.o.   Date: 10/14/2021  Time: 11:47     Provider: Stormy Fabianathy L Gaelyn Tukes, NP-C    Reason for visit: Follow Up      History of Present Illness:  Frank Barton is a 76 y.o. male presenting with chronic disease management.    Patient Active Problem List    Diagnosis Date Noted   . Type 2 diabetes mellitus with stage 3 chronic kidney disease, without long-term current use of insulin (CMS HCC) 10/14/2021   . Essential hypertension 10/14/2021   . Mixed hyperlipidemia 10/14/2021   . Gastroesophageal reflux disease 10/14/2021   . COPD (chronic obstructive pulmonary disease) (CMS HCC) 10/14/2021   . Tobacco dependence due to cigarettes 10/14/2021   . B12 deficiency 10/14/2021   . Vitamin D deficiency 10/14/2021   . Obesity (BMI 30.0-34.9) 10/14/2021   . PAF (paroxysmal atrial fibrillation) (CMS HCC) 10/14/2021       Historical Data    Past Medical History:  History reviewed. No pertinent past medical history.  Past Surgical History:  History reviewed. No pertinent surgical history.  Allergies:  Allergies   Allergen Reactions   . Latex, Natural Rubber Shortness of Breath   . Levofloxacin Hives/ Urticaria and  Other Adverse Reaction (Add comment)     Hives  Was taking Levaquin, Miralax and Spiriva at same time.  MDs unsure of which caused reaction     . Polyethylene Glycol 3350 Hives/ Urticaria and  Other Adverse Reaction (Add comment)     Hives  Was taking Levaquin, Miralax and Spiriva at same time.  MDs unsure of which caused reaction     . Tiotropium Bromide Hives/ Urticaria and  Other Adverse Reaction (Add comment)     hives  Was taking Levaquin, Miralax and Spiriva at same time.  MDs unsure of which caused reaction     . Polyethylene Glycol Hives/ Urticaria   . Polyethylene Glycol 300 Hives/ Urticaria      Medications:  Current Outpatient Medications   Medication Sig   . amLODIPine (NORVASC) 5 mg Oral Tablet Take 1 Tablet (5 mg total) by mouth Once a day for 90 days   . apixaban (ELIQUIS) 5 mg Oral Tablet Take 1 Tablet (5 mg total) by mouth Twice daily for 90 days   . azelastine (ASTELIN) 137 mcg (0.1 %) Nasal Aerosol, Spray Administer 2 Sprays into each nostril Once a day for 90 days   . candesartan (ATACAND) 32 mg Oral Tablet Take 1 Tablet (32 mg total) by mouth Once a day for 90 days   . cyanocobalamin (VITAMIN B-12) 500 mcg Oral Tablet Take 1 Tablet (500 mcg total) by mouth   . dronedarone (MULTAQ) 400 mg Oral Tablet Take 1 Tablet (400 mg total) by mouth Twice daily with food for 90 days   . famotidine (PEPCID) 40 mg Oral Tablet Take 1 Tablet (40 mg total) by mouth Every evening for 90 days   . ferrous sulfate (FEOSOL) 325 mg (65 mg iron) Oral Tablet Take 1 Tablet (325 mg total) by mouth (Patient not taking: Reported on 10/14/2021)   . fluticasone propionate (FLONASE) 50 mcg/actuation Nasal Spray, Suspension    . metoprolol succinate (TOPROL-XL) 50 mg Oral Tablet Sustained Release 24 hr Take 1 Tablet (50 mg  total) by mouth Once a day for 90 days   . montelukast (SINGULAIR) 10 mg Oral Tablet Take 1 Tablet (10 mg total) by mouth Once a day for 90 days   . nitroGLYCERIN (NITROSTAT) 0.4 mg Sublingual Tablet, Sublingual Place 1 Tablet (0.4 mg total) under the tongue Every morning after breakfast   . omeprazole (PRILOSEC) 40 mg Oral Capsule, Delayed Release(E.C.) Take 1 Capsule (40 mg total) by mouth Once a day for 90 days   . QVAR REDIHALER 80 mcg/actuation Inhalation oral inhaler    . rosuvastatin (CRESTOR) 20 mg Oral Tablet Take 1 Tablet (20 mg total) by mouth Once a day for 90 days     Family History:  Family Medical History:    None         Social History:  Social History     Socioeconomic History   . Marital status: Married   Tobacco Use   . Smoking status: Never   . Smokeless tobacco: Never   Vaping Use   .  Vaping Use: Never used   Substance and Sexual Activity   . Alcohol use: Never   . Drug use: Never   . Sexual activity: Not Currently           Review of Systems:  Any pertinent Review of Systems as addressed in the HPI above.    Physical Exam:  Vital Signs:  Vitals:    10/14/21 1115   BP: (!) 138/58   Pulse: 80   Resp: 18   Temp: 36.8 C (98.3 F)   TempSrc: Tympanic   SpO2: 95%   Weight: 98 kg (216 lb)   Height: 1.702 m (5\' 7" )   BMI: 33.9     Physical Exam  Constitutional:       Appearance: Normal appearance. He is normal weight.   Cardiovascular:      Rate and Rhythm: Normal rate and regular rhythm.      Pulses: Normal pulses.      Heart sounds: Normal heart sounds.   Pulmonary:      Effort: Pulmonary effort is normal.      Breath sounds: Normal breath sounds.   Abdominal:      General: Bowel sounds are normal.      Palpations: Abdomen is soft.   Musculoskeletal:         General: Normal range of motion.      Cervical back: Normal range of motion and neck supple.   Skin:     General: Skin is warm and dry.   Neurological:      General: No focal deficit present.      Mental Status: He is alert and oriented to person, place, and time. Mental status is at baseline.   Psychiatric:         Mood and Affect: Mood normal.         Behavior: Behavior normal.         Thought Content: Thought content normal.         Judgment: Judgment normal.          Assessment/Plan:  (E11.22,  N18.30) Type 2 diabetes mellitus with stage 3 chronic kidney disease, without long-term current use of insulin, unspecified whether stage 3a or 3b CKD (CMS HCC)  (primary encounter diagnosis)  Plan: CBC, COMPREHENSIVE METABOLIC PNL, FASTING,         HGA1C (HEMOGLOBIN A1C WITH EST AVG GLUCOSE),         LIPID PANEL, VITAMIN B12, VITAMIN D  25 TOTAL,         MICROALBUMIN URINE, RANDOM    (I10) Essential hypertension  Plan: CBC, COMPREHENSIVE METABOLIC PNL, FASTING,         HGA1C (HEMOGLOBIN A1C WITH EST AVG GLUCOSE),         LIPID PANEL, VITAMIN B12,  VITAMIN D 25 TOTAL,         MICROALBUMIN URINE, RANDOM    (E78.2) Mixed hyperlipidemia  Plan: CBC, COMPREHENSIVE METABOLIC PNL, FASTING,         HGA1C (HEMOGLOBIN A1C WITH EST AVG GLUCOSE),         LIPID PANEL, VITAMIN B12, VITAMIN D 25 TOTAL,         MICROALBUMIN URINE, RANDOM    (K21.9) Gastroesophageal reflux disease, unspecified whether esophagitis present  Plan: CBC, COMPREHENSIVE METABOLIC PNL, FASTING,         HGA1C (HEMOGLOBIN A1C WITH EST AVG GLUCOSE),         LIPID PANEL, VITAMIN B12, VITAMIN D 25 TOTAL,         MICROALBUMIN URINE, RANDOM    (J44.9) COPD (chronic obstructive pulmonary disease) (CMS HCC)  Plan: CBC, COMPREHENSIVE METABOLIC PNL, FASTING,         HGA1C (HEMOGLOBIN A1C WITH EST AVG GLUCOSE),         LIPID PANEL, VITAMIN B12, VITAMIN D 25 TOTAL,         MICROALBUMIN URINE, RANDOM    (F17.210) Tobacco dependence due to cigarettes  Plan: CBC, COMPREHENSIVE METABOLIC PNL, FASTING,         HGA1C (HEMOGLOBIN A1C WITH EST AVG GLUCOSE),         LIPID PANEL, VITAMIN B12, VITAMIN D 25 TOTAL,         MICROALBUMIN URINE, RANDOM    (E53.8) B12 deficiency  Plan: CBC, COMPREHENSIVE METABOLIC PNL, FASTING,         HGA1C (HEMOGLOBIN A1C WITH EST AVG GLUCOSE),         LIPID PANEL, VITAMIN B12, VITAMIN D 25 TOTAL,         MICROALBUMIN URINE, RANDOM    (E55.9) Vitamin D deficiency  Plan: CBC, COMPREHENSIVE METABOLIC PNL, FASTING,         HGA1C (HEMOGLOBIN A1C WITH EST AVG GLUCOSE),         LIPID PANEL, VITAMIN B12, VITAMIN D 25 TOTAL,         MICROALBUMIN URINE, RANDOM    (E66.9) Obesity (BMI 30.0-34.9)  Plan: CBC, COMPREHENSIVE METABOLIC PNL, FASTING,         HGA1C (HEMOGLOBIN A1C WITH EST AVG GLUCOSE),         LIPID PANEL, VITAMIN B12, VITAMIN D 25 TOTAL,         MICROALBUMIN URINE, RANDOM    (I48.0) PAF (paroxysmal atrial fibrillation) (CMS HCC)       Labs drawn today.  Continue current medications.  Advised a low-fat/low sodium diet, advised 150 minutes of scheduled weekly physical activity as tolerated.   Advised to maintain a healthy weight.      Return in about 3 months (around 01/14/2022) for routine visit; schedule adult wellness visit.    Stormy Fabian, NP-C     Portions of this note may be dictated using voice recognition software or a dictation service. Variances in spelling and vocabulary are possible and unintentional. Not all errors are caught/corrected. Please notify the [facility] Parkin if any discrepancies are noted or if the meaning of any statement is not clear.

## 2021-10-15 ENCOUNTER — Other Ambulatory Visit (RURAL_HEALTH_CENTER): Payer: Self-pay | Admitting: Family

## 2021-10-15 DIAGNOSIS — E871 Hypo-osmolality and hyponatremia: Secondary | ICD-10-CM

## 2021-10-17 ENCOUNTER — Other Ambulatory Visit (RURAL_HEALTH_CENTER): Payer: Self-pay | Admitting: Family

## 2021-10-17 ENCOUNTER — Telehealth (RURAL_HEALTH_CENTER): Payer: Self-pay | Admitting: Family

## 2021-10-17 NOTE — Telephone Encounter (Signed)
Patient is aware of results, he states he is back in [place] and will try to come next week to get labs done.

## 2021-10-17 NOTE — Telephone Encounter (Signed)
-----   Message from Stormy Fabian, NP-C sent at 10/15/2021  6:17 PM EDT -----  Please call patient labs indicate his kidney function is 1.79 in his GFR is 30.  This is stage IIIB chronic kidney disease.  If patient is eating a referral for Nephrology, please let me know.  Patient has been seeing a nephrologist in [place] and I want to make sure that he is still doing so.    Inform patient his sodium is slightly low.  I would like for him to return to the clinic to recheck the sodium level.  Lab order in chart, lab visit only.      Advised to make sure he is eating and drinking well.  Stay well hydrated.  Advised to continue current medications.  Otherwise, follow up in 3 months as scheduled.

## 2021-11-19 ENCOUNTER — Ambulatory Visit: Payer: Medicare PPO | Admitting: Allergy and Immunology

## 2021-11-19 VITALS — BP 142/60 | HR 68 | Resp 20

## 2021-11-19 DIAGNOSIS — J4540 Moderate persistent asthma, uncomplicated: Secondary | ICD-10-CM

## 2021-11-19 DIAGNOSIS — J3089 Other allergic rhinitis: Secondary | ICD-10-CM | POA: Diagnosis not present

## 2021-11-19 DIAGNOSIS — K219 Gastro-esophageal reflux disease without esophagitis: Secondary | ICD-10-CM

## 2021-11-19 MED ORDER — MONTELUKAST SODIUM 10 MG PO TABS: ORAL_TABLET | ORAL | 1 refills | Status: DC

## 2021-11-19 MED ORDER — ALBUTEROL SULFATE HFA 108 (90 BASE) MCG/ACT IN AERS: INHALATION_SPRAY | RESPIRATORY_TRACT | 1 refills | Status: DC

## 2021-11-19 MED ORDER — OMEPRAZOLE 40 MG PO CPDR: DELAYED_RELEASE_CAPSULE | ORAL | 1 refills | Status: DC

## 2021-11-19 MED ORDER — FLUTICASONE PROPIONATE 50 MCG/ACT NA SUSP: NASAL | 1 refills | Status: DC

## 2021-11-19 MED ORDER — AZELASTINE HCL 0.1 % NA SOLN: NASAL | 1 refills | Status: DC

## 2021-11-19 MED ORDER — FLUTICASONE PROPIONATE HFA 110 MCG/ACT IN AERO: 2.0000 | INHALATION_SPRAY | Freq: Two times a day (BID) | RESPIRATORY_TRACT | 1 refills | Status: DC

## 2021-11-19 MED ORDER — FAMOTIDINE 40 MG PO TABS: 40.0000 mg | ORAL_TABLET | Freq: Every day | ORAL | 1 refills | Status: DC

## 2021-11-19 NOTE — Patient Instructions (Addendum)
  1. Continue reflux therapy:   A.  Omeprazole 40 mg - 1 tablet in AM  B.  Famotidine 40 mg - 1 tablet in PM  2. Continue anti-inflammatory therapy for chest:    A. Generic Flovent 110 - 2 inhalations 2 times per day w/ spacer (empty lungs)  B. montelukast 10 mg daily  3. Continue anti-inflammatory therapy for nose:   A. Afrin - 1 spray in single nostril in evening. Alternate nostrils  B. flonase + astelin - 1 spray each in both nostrils in evening  D. flonase + astelin - 1 spray each in both nostrils in morning  4. If Needed:   A. Proventil HFA or Xopenex HFA if needed  B. Zyrtec one tablet one time per day   C. nasal saline multiple times a day   D. Mucinex DM 2 tablets twice a day   5. "Action Plan" for flare up:   A. Increase Flovent to 3 inhalations 3 times per day  B. Use Albuterol HFA if needed  6. For this recent episode:   A. Prednisone 10 mg - 1 tablet 1 time per day for 10 days only  7.  Return in 6 months or earlier if problem

## 2021-11-19 NOTE — Progress Notes (Signed)
[name]   Follow-up Note  Referring Provider: Stormy FabianBryson, Cathy L, NP Primary Provider: Stormy FabianBryson, Cathy L, NP Date of Office Visit: 11/19/2021  Subjective:   Bob Newman Check (DOB: 1946/05/05) is a 76 y.o. male who [facility] in re-evaluation of the following:  HPI: Bob Newman returns to this clinic in evaluation of asthma, allergic rhinitis, and LPR.  His last visit to this clinic was 21 May 2021.  Recently he developed a cough starting about 2 weeks ago after he weed eating for an hour or so.  It appeared to occur within an hour after this weed eating was completed and he had cough and shortness of breath and felt raw in his chest.  He is somewhat better but he still continues to cough.  He has not had any associated fever or sputum production or chest pain or significant upper airway symptoms.  Other than this event he has actually done very well regarding his asthma and his allergic rhinitis.  He rarely has any problems with either his chest or his nose.  He continues to use Qvar although it has apparently become too expensive for him to receive his Qvar.  He just purchased 3 canisters and it cost him over $350 and he would like a substitute.  Rarely does use a short acting bronchodilator.  He believes that his reflux is under very good control.  He has an appointment to see his nephrologist in July.  Apparently his previous nephrologist has left the practice and he is going to be seeing a new nephrologist.  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  He apparently received a steroid shot from his primary care doctor in February 2023 to deal with this issue and it helped his hands dramatically.  His hand issue only returned when he started using the weedeater again.  Allergies as of 11/19/2021       Reactions    Latex Shortness Of Breath   Levaquin [levofloxacin] Hives   Miralax [polyethylene Glycol] Hives   Spiriva Handihaler [tiotropium Bromide Monohydrate] Hives        Medication List    amLODipine 5 MG tablet Commonly known as: NORVASC 5 mg daily.   azelastine 0.1 % nasal spray Commonly known as: ASTELIN Use one spray in each nostril twice daily as directed.   candesartan 32 MG tablet Commonly known as: ATACAND Take 32 mg by mouth daily.   Crestor 20 MG tablet Generic drug: rosuvastatin Take 20 mg by mouth daily.   dronedarone 400 MG tablet Commonly known as: MULTAQ Take 400 mg by mouth 2 (two) times daily with a meal.   Eliquis 5 MG Tabs tablet Generic drug: apixaban   famotidine 40 MG tablet Commonly known as: PEPCID Take 1 tablet (40 mg total) by mouth at bedtime.   ferrous sulfate 325 (65 FE) MG tablet Take by mouth.   fluticasone 50 MCG/ACT nasal spray Commonly known as: FLONASE One spray in both nostrils in the morning and the evening   IRON PO Take by mouth.   levalbuterol 45 MCG/ACT inhaler Commonly known as: Xopenex HFA Can use two puffs every four to six hours as needed for cough or wheeze.   metoprolol succinate 50 MG 24 hr tablet Commonly known as: TOPROL-XL Take 25 mg by mouth daily.  montelukast 10 MG tablet Commonly known as: SINGULAIR TAKE ONE TABLET ONCE DAILY   nitroGLYCERIN 0.4 MG SL tablet Commonly known as: NITROSTAT   omeprazole 40 MG capsule Commonly known as: PRILOSEC TAKE ONE CAPSULE (40 MG TOTAL) BY MOUTH EVERY MORNING AS DIRECTED.   Qvar RediHaler 80 MCG/ACT inhaler Generic drug: beclomethasone Inhale two doses twice daily to prevent cough or wheeze.  Rinse, gargle, and spit after use.    Past Medical History:  Diagnosis Date   Allergic rhinoconjunctivitis    Asthma    Atrial fibrillation (HCC)    GERD (gastroesophageal reflux disease)    High blood pressure     Past Surgical History:  Procedure Laterality  Date   CHOLECYSTECTOMY     THROMBOENDARTERECTOMY Right 09/22/2016   Boykin Reaper, [facility] at [facility]   TONSILLECTOMY      Review of systems negative except as noted in HPI / PMHx or noted below:  Review of Systems  Constitutional: Negative.   HENT: Negative.    Eyes: Negative.   Respiratory: Negative.    Cardiovascular: Negative.   Gastrointestinal: Negative.   Genitourinary: Negative.   Musculoskeletal: Negative.   Skin: Negative.   Neurological: Negative.   Endo/Heme/Allergies: Negative.   Psychiatric/Behavioral: Negative.       Objective:   Vitals:   11/19/21 1105  BP: (!) 142/60  Pulse: 68  Resp: 20  SpO2: 96%          Physical Exam Constitutional:      Appearance: He is not diaphoretic.  HENT:     Head: Normocephalic.     Right Ear: Tympanic membrane, ear canal and external ear normal.     Left Ear: Tympanic membrane, ear canal and external ear normal.     Nose: Nose normal. No mucosal edema or rhinorrhea.     Mouth/Throat:     Pharynx: Uvula midline. No oropharyngeal exudate.  Eyes:     Conjunctiva/sclera: Conjunctivae normal.  Neck:     Thyroid: No thyromegaly.     Trachea: Trachea normal. No tracheal tenderness or tracheal deviation.  Cardiovascular:     Rate and Rhythm: Normal rate and regular rhythm.     Heart sounds: Normal heart sounds, S1 normal and S2 normal. No murmur heard. Pulmonary:     Effort: No respiratory distress.     Breath sounds: Normal breath sounds. No stridor. No wheezing or rales.  Lymphadenopathy:     Head:     Right side of head: No tonsillar adenopathy.     Left side of head: No tonsillar adenopathy.     Cervical: No cervical adenopathy.  Skin:    Findings: No erythema or rash.     Nails: There is no clubbing.  Neurological:     Mental Status: He is alert.     Diagnostics:    Spirometry was performed and demonstrated an FEV1 of 1.95 at 73 % of predicted.  Assessment and Plan:   1. Asthma, moderate  persistent, well-controlled   2. Other allergic rhinitis   3. LPRD (laryngopharyngeal reflux disease)    1. Continue reflux therapy:   A.  Omeprazole 40 mg - 1 tablet in AM  B.  Famotidine 40 mg - 1 tablet in PM  2. Continue anti-inflammatory therapy for chest:    A. Generic Flovent 110 - 2 inhalations 2 times per day w/ spacer (empty lungs)  B. montelukast 10 mg daily  3. Continue anti-inflammatory therapy for nose:   A. Afrin - 1  spray in single nostril in evening. Alternate nostrils  B. flonase + astelin - 1 spray each in both nostrils in evening  D. flonase + astelin - 1 spray each in both nostrils in morning  4. If Needed:   A. Proventil HFA or Xopenex HFA if needed  B. Zyrtec one tablet one time per day   C. nasal saline multiple times a day   D. Mucinex DM 2 tablets twice a day   5. "Action Plan" for flare up:   A. Increase Flovent to 3 inhalations 3 times per day  B. Use Albuterol HFA if needed  6. For this recent episode:   A. Prednisone 10 mg - 1 tablet 1 time per day for 10 days only  7.  Return in 6 months or earlier if problem  Parthiv appears to have developed some inflammation secondary to weed eating particulate exposure about 2 weeks ago and although he is somewhat better he obviously has some evidence of inflammation within his airway and we will treat him with a low-dose of systemic steroids as noted above why he continues on a collection of anti-inflammatory agents for his airway including an inhaled steroid and montelukast.  Currently he is using Qvar but he will be switched over to Flovent once his Qvar supply completes for his insurance company will apparently not pay for this medication at this point.  He will continue to use other medications regarding inflammation of his airway and reflux as noted above.  I will see him back in this clinic in 6 months or earlier if there is a problem.  Laurette Schimke, MD Allergy / Immunology [facility]

## 2021-11-20 ENCOUNTER — Encounter: Payer: Self-pay | Admitting: Allergy and Immunology

## 2022-01-14 ENCOUNTER — Ambulatory Visit (RURAL_HEALTH_CENTER): Payer: Medicare Other | Attending: Family | Admitting: Family

## 2022-01-14 ENCOUNTER — Other Ambulatory Visit: Payer: Self-pay

## 2022-01-14 ENCOUNTER — Ambulatory Visit: Payer: Medicare Other | Attending: Family | Admitting: Family

## 2022-01-14 ENCOUNTER — Encounter (RURAL_HEALTH_CENTER): Payer: Self-pay | Admitting: Family

## 2022-01-14 VITALS — BP 140/63 | HR 77 | Temp 96.6°F | Resp 17 | Ht 67.0 in | Wt 215.6 lb

## 2022-01-14 DIAGNOSIS — I10 Essential (primary) hypertension: Secondary | ICD-10-CM | POA: Insufficient documentation

## 2022-01-14 DIAGNOSIS — J449 Chronic obstructive pulmonary disease, unspecified: Secondary | ICD-10-CM | POA: Insufficient documentation

## 2022-01-14 DIAGNOSIS — D649 Anemia, unspecified: Secondary | ICD-10-CM | POA: Insufficient documentation

## 2022-01-14 DIAGNOSIS — I480 Paroxysmal atrial fibrillation: Secondary | ICD-10-CM | POA: Insufficient documentation

## 2022-01-14 DIAGNOSIS — K219 Gastro-esophageal reflux disease without esophagitis: Secondary | ICD-10-CM

## 2022-01-14 DIAGNOSIS — F17210 Nicotine dependence, cigarettes, uncomplicated: Secondary | ICD-10-CM

## 2022-01-14 DIAGNOSIS — E538 Deficiency of other specified B group vitamins: Secondary | ICD-10-CM

## 2022-01-14 DIAGNOSIS — I129 Hypertensive chronic kidney disease with stage 1 through stage 4 chronic kidney disease, or unspecified chronic kidney disease: Secondary | ICD-10-CM | POA: Insufficient documentation

## 2022-01-14 DIAGNOSIS — N183 Chronic kidney disease, stage 3 unspecified: Secondary | ICD-10-CM | POA: Insufficient documentation

## 2022-01-14 DIAGNOSIS — E1122 Type 2 diabetes mellitus with diabetic chronic kidney disease: Secondary | ICD-10-CM | POA: Insufficient documentation

## 2022-01-14 DIAGNOSIS — Z720 Tobacco use: Secondary | ICD-10-CM | POA: Insufficient documentation

## 2022-01-14 DIAGNOSIS — D509 Iron deficiency anemia, unspecified: Secondary | ICD-10-CM | POA: Insufficient documentation

## 2022-01-14 DIAGNOSIS — H6123 Impacted cerumen, bilateral: Secondary | ICD-10-CM | POA: Insufficient documentation

## 2022-01-14 DIAGNOSIS — E559 Vitamin D deficiency, unspecified: Secondary | ICD-10-CM | POA: Insufficient documentation

## 2022-01-14 DIAGNOSIS — E782 Mixed hyperlipidemia: Secondary | ICD-10-CM

## 2022-01-14 HISTORY — DX: Impacted cerumen, bilateral: H61.23

## 2022-01-14 LAB — COMPREHENSIVE METABOLIC PNL, FASTING
ALBUMIN/GLOBULIN RATIO: 1.2 (ref 0.8–1.4)
ALBUMIN: 4.3 g/dL (ref 3.5–5.7)
ALKALINE PHOSPHATASE: 56 U/L (ref 34–104)
ALT (SGPT): 23 U/L (ref 7–52)
ANION GAP: 9 mmol/L — ABNORMAL LOW (ref 10–20)
AST (SGOT): 22 U/L (ref 13–39)
BILIRUBIN TOTAL: 0.5 mg/dL (ref 0.3–1.2)
BUN/CREA RATIO: 11 (ref 6–22)
BUN: 17 mg/dL (ref 7–25)
CALCIUM, CORRECTED: 9.8 mg/dL (ref 8.9–10.8)
CALCIUM: 10.1 mg/dL (ref 8.6–10.3)
CHLORIDE: 101 mmol/L (ref 98–107)
CO2 TOTAL: 22 mmol/L (ref 21–31)
CREATININE: 1.56 mg/dL — ABNORMAL HIGH (ref 0.60–1.30)
ESTIMATED GFR: 45 mL/min/{1.73_m2} — ABNORMAL LOW (ref >59)
GLOBULIN: 3.5 (ref 2.9–5.4)
GLUCOSE: 127 mg/dL — ABNORMAL HIGH (ref 74–109)
OSMOLALITY, CALCULATED: 268 mOsm/kg — ABNORMAL LOW (ref 270–290)
POTASSIUM: 4.1 mmol/L (ref 3.5–5.1)
PROTEIN TOTAL: 7.8 g/dL (ref 6.4–8.9)
SODIUM: 132 mmol/L — ABNORMAL LOW (ref 136–145)

## 2022-01-14 LAB — MAGNESIUM: MAGNESIUM: 1.8 mg/dL — ABNORMAL LOW (ref 1.9–2.7)

## 2022-01-14 LAB — MICROALBUMIN/CREATININE RATIO, URINE, RANDOM
CREATININE RANDOM URINE: 326 mg/dL — ABNORMAL HIGH (ref 11–26)
MICROALBUMIN RANDOM URINE: 7.3 mg/dL
MICROALBUMIN/CREATININE RATIO RANDOM URINE: 22.4 mg/g

## 2022-01-14 LAB — IRON TRANSFERRIN AND TIBC
IRON (TRANSFERRIN) SATURATION: 6 % — ABNORMAL LOW (ref 20–50)
IRON: 33 ug/dL — ABNORMAL LOW (ref 50–212)
TOTAL IRON BINDING CAPACITY: 517 ug/dL — ABNORMAL HIGH (ref 250–450)
TRANSFERRIN: 369 mg/dL — ABNORMAL HIGH (ref 203–362)
UIBC: 484 ug/dL — ABNORMAL HIGH (ref 130–375)

## 2022-01-14 LAB — CBC
HCT: 38.5 % — ABNORMAL LOW (ref 42.0–51.0)
HGB: 12.3 g/dL — ABNORMAL LOW (ref 13.5–18.0)
MCH: 25.4 pg — ABNORMAL LOW (ref 27.0–32.0)
MCHC: 31.9 g/dL — ABNORMAL LOW (ref 32.0–36.0)
MCV: 79.6 fL (ref 78.0–99.0)
MPV: 9.3 fL (ref 7.4–10.4)
PLATELETS: 364 10*3/uL (ref 140–440)
RBC: 4.83 10*6/uL (ref 4.20–6.00)
RDW: 15.4 % — ABNORMAL HIGH (ref 11.6–14.8)
WBC: 12 10*3/uL — ABNORMAL HIGH (ref 4.0–10.5)
WBCS UNCORRECTED: 12 10*3/uL

## 2022-01-14 LAB — LIPID PANEL
CHOL/HDL RATIO: 2.6
CHOLESTEROL: 98 mg/dL (ref <200)
HDL CHOL: 38 mg/dL (ref 23–92)
LDL CALC: 34 mg/dL (ref 0–100)
TRIGLYCERIDES: 128 mg/dL (ref <=150)
VLDL CALC: 26 mg/dL (ref 0–50)

## 2022-01-14 LAB — VITAMIN D 25 TOTAL: VITAMIN D: 34 ng/mL (ref 30–100)

## 2022-01-14 LAB — VITAMIN B12: VITAMIN B 12: 256 pg/mL (ref 180–914)

## 2022-01-14 LAB — FERRITIN: FERRITIN: 15 ng/mL (ref 11–336)

## 2022-01-14 LAB — THYROID STIMULATING HORMONE (SENSITIVE TSH): TSH: 3.501 u[IU]/mL (ref 0.450–5.330)

## 2022-01-14 NOTE — Progress Notes (Addendum)
FAMILY MEDICINE, [facility] FAM[facility]  [address]  [place] [address]  Operated by [facility]     Name: [name] MRN:  [id number]   Date of Birth: [birth year] Age: 76 y.o.   Date: 01/14/2022  Time: 11:04     Provider: Stormy Fabian, NP-C    Reason for visit: Follow Up 3 Months      History of Present Illness:  Frank Barton is a 76 y.o. male presenting with chronic disease management.    Patient Active Problem List    Diagnosis Date Noted    Low hemoglobin 01/14/2022    Excessive cerumen in both ear canals 01/14/2022    CKD (chronic kidney disease) stage 3, GFR 30-59 ml/min (CMS HCC) 01/14/2022     Managed by [facility], Dr. Malen Gauze.      Type 2 diabetes mellitus with stage 3 chronic kidney disease, without long-term current use of insulin (CMS HCC) 10/14/2021    Essential hypertension 10/14/2021    Mixed hyperlipidemia 10/14/2021    Gastroesophageal reflux disease 10/14/2021    COPD (chronic obstructive pulmonary disease) (CMS HCC) 10/14/2021    Tobacco dependence due to cigarettes 10/14/2021    B12 deficiency 10/14/2021    Vitamin D deficiency 10/14/2021    Obesity (BMI 30.0-34.9) 10/14/2021    PAF (paroxysmal atrial fibrillation) (CMS HCC) 10/14/2021       Historical Data    Past Medical History:  Past Medical History:   Diagnosis Date    Allergic rhinitis     Anxiety state     Asthma     Atrial fibrillation (CMS HCC)     Chronic obstructive airway disease (CMS HCC)     CKD (chronic kidney disease)     stage 3    Diabetes mellitus, type 2 (CMS HCC)     Esophageal reflux     Hypercholesterolemia     Hypertension     Hypokalemia     Hyponatremia     Iron deficiency anemia, unspecified     Leaky heart valve     Vitamin B12 deficiency     Vitamin D deficiency      Past Surgical History:  Past Surgical History:   Procedure Laterality Date    HX CAROTID ENDARTERECTOMY      HX CHOLECYSTECTOMY       Allergies:  Allergies   Allergen Reactions     Latex, Natural Rubber Shortness of Breath    Levofloxacin Hives/ Urticaria and  Other Adverse Reaction (Add comment)     Hives  Was taking Levaquin, Miralax and Spiriva at same time.  MDs unsure of which caused reaction      Polyethylene Glycol 3350 Hives/ Urticaria and  Other Adverse Reaction (Add comment)     Hives  Was taking Levaquin, Miralax and Spiriva at same time.  MDs unsure of which caused reaction      Tiotropium Bromide Hives/ Urticaria and  Other Adverse Reaction (Add comment)     hives  Was taking Levaquin, Miralax and Spiriva at same time.  MDs unsure of which caused reaction      Polyethylene Glycol Hives/ Urticaria    Polyethylene Glycol 300 Hives/ Urticaria     Medications:  Current Outpatient Medications   Medication Sig    amLODIPine (NORVASC) 5 mg Oral Tablet Take 1 Tablet (5 mg total) by mouth Once a day for 90 days    apixaban (ELIQUIS) 5 mg Oral Tablet  Take 1 Tablet (5 mg total) by mouth Twice daily for 90 days    azelastine (ASTELIN) 137 mcg (0.1 %) Nasal Aerosol, Spray Administer 2 Sprays into each nostril Once a day for 90 days    candesartan (ATACAND) 32 mg Oral Tablet Take 1 Tablet (32 mg total) by mouth Once a day for 90 days    cyanocobalamin (VITAMIN B-12) 500 mcg Oral Tablet Take 1 Tablet (500 mcg total) by mouth (Patient not taking: Reported on 01/14/2022)    dronedarone (MULTAQ) 400 mg Oral Tablet Take 1 Tablet (400 mg total) by mouth Twice daily with food for 90 days    famotidine (PEPCID) 40 mg Oral Tablet Take 1 Tablet (40 mg total) by mouth Every evening for 90 days    ferrous sulfate (FEOSOL) 325 mg (65 mg iron) Oral Tablet Take 1 Tablet (325 mg total) by mouth (Patient not taking: Reported on 10/14/2021)    fluticasone propionate (FLONASE) 50 mcg/actuation Nasal Spray, Suspension     metoprolol succinate (TOPROL-XL) 50 mg Oral Tablet Sustained Release 24 hr Take 1 Tablet (50 mg total) by mouth Once a day for 90 days    montelukast (SINGULAIR) 10 mg Oral Tablet Take 1 Tablet  (10 mg total) by mouth Once a day for 90 days    nitroGLYCERIN (NITROSTAT) 0.4 mg Sublingual Tablet, Sublingual Place 1 Tablet (0.4 mg total) under the tongue Every morning after breakfast    omeprazole (PRILOSEC) 40 mg Oral Capsule, Delayed Release(E.C.) Take 1 Capsule (40 mg total) by mouth Once a day for 90 days    QVAR REDIHALER 80 mcg/actuation Inhalation oral inhaler     rosuvastatin (CRESTOR) 20 mg Oral Tablet Take 1 Tablet (20 mg total) by mouth Once a day for 90 days     Family History:  Family Medical History:       Problem Relation (Age of Onset)    CKD Father    Cancer Mother    Diabetes Mother, Brother    Heart Attack Father    Heart Disease Brother            Social History:  Social History     Socioeconomic History    Marital status: Married   Tobacco Use    Smoking status: Never    Smokeless tobacco: Current     Types: Chew   Vaping Use    Vaping Use: Never used   Substance and Sexual Activity    Alcohol use: Never    Drug use: Never    Sexual activity: Not Currently     Social Determinants of Health     Financial Resource Strain: Low Risk     SDOH Financial: No   Transportation Needs: Low Risk     SDOH Transportation: No   Social Connections: Low Risk     SDOH Social Isolation: 5 or more times a week   Intimate Partner Violence: Low Risk     SDOH Domestic Violence: No   Housing Stability: Low Risk     SDOH Housing Situation: I have housing.    SDOH Housing Worry: No           Review of Systems:  Any pertinent Review of Systems as addressed in the HPI above.    Physical Exam:  Vital Signs:  Vitals:    01/14/22 1038   BP: (!) 140/63   Pulse: 77   Resp: 17   Temp: (!) 35.9 C (96.6 F)   SpO2: 95%   Weight:  97.8 kg (215 lb 9.6 oz)   Height: 1.702 m (5\' 7" )   BMI: 33.84     Physical Exam  Constitutional:       Appearance: Normal appearance. He is normal weight.   HENT:      Right Ear: There is impacted cerumen.      Left Ear: There is impacted cerumen.      Nose: Rhinorrhea present.   Eyes:       Extraocular Movements: Extraocular movements intact.      Conjunctiva/sclera: Conjunctivae normal.      Pupils: Pupils are equal, round, and reactive to light.   Cardiovascular:      Rate and Rhythm: Normal rate and regular rhythm.      Pulses: Normal pulses.      Heart sounds: Normal heart sounds.   Pulmonary:      Effort: Pulmonary effort is normal.      Breath sounds: Normal breath sounds.   Abdominal:      General: Bowel sounds are normal.      Palpations: Abdomen is soft.   Musculoskeletal:         General: Normal range of motion.      Cervical back: Neck supple.   Skin:     General: Skin is warm and dry.   Neurological:      General: No focal deficit present.      Mental Status: He is alert and oriented to person, place, and time. Mental status is at baseline.      Cranial Nerves: Cranial nerves 2-12 are intact.      Motor: Motor function is intact.      Coordination: Coordination is intact.      Gait: Gait is intact.   Psychiatric:         Mood and Affect: Mood normal.         Behavior: Behavior normal. Behavior is cooperative.         Thought Content: Thought content normal.         Cognition and Memory: Cognition normal.         Judgment: Judgment normal.        Assessment/Plan:  (E11.22,  N18.30) Type 2 diabetes mellitus with stage 3 chronic kidney disease, without long-term current use of insulin, unspecified whether stage 3a or 3b CKD (CMS HCC)  (primary encounter diagnosis)  Plan: HGA1C (HEMOGLOBIN A1C WITH EST AVG GLUCOSE),         MICROALBUMIN/CREATININE RATIO, URINE, RANDOM,         CBC, COMPREHENSIVE METABOLIC PNL, FASTING,         LIPID PANEL, MAGNESIUM, THYROID STIMULATING         HORMONE (SENSITIVE TSH), VITAMIN D 25 TOTAL,         VITAMIN B12    (I10) Essential hypertension  Plan: HGA1C (HEMOGLOBIN A1C WITH EST AVG GLUCOSE),         MICROALBUMIN/CREATININE RATIO, URINE, RANDOM,         CBC, COMPREHENSIVE METABOLIC PNL, FASTING,         LIPID PANEL, MAGNESIUM, THYROID STIMULATING         HORMONE  (SENSITIVE TSH), VITAMIN D 25 TOTAL,         VITAMIN B12    (E78.2) Mixed hyperlipidemia  Plan: HGA1C (HEMOGLOBIN A1C WITH EST AVG GLUCOSE),         MICROALBUMIN/CREATININE RATIO, URINE, RANDOM,         CBC, COMPREHENSIVE METABOLIC PNL, FASTING,  LIPID PANEL, MAGNESIUM, THYROID STIMULATING         HORMONE (SENSITIVE TSH), VITAMIN D 25 TOTAL,         VITAMIN B12    (J44.9) Chronic obstructive pulmonary disease, unspecified COPD type (CMS HCC)  Plan: HGA1C (HEMOGLOBIN A1C WITH EST AVG GLUCOSE),         MICROALBUMIN/CREATININE RATIO, URINE, RANDOM,         CBC, COMPREHENSIVE METABOLIC PNL, FASTING,         LIPID PANEL, MAGNESIUM, THYROID STIMULATING         HORMONE (SENSITIVE TSH), VITAMIN D 25 TOTAL,         VITAMIN B12    (N18.30) CKD (chronic kidney disease) stage 3, GFR 30-59 ml/min (CMS HCC)    (K21.9) Gastroesophageal reflux disease, unspecified whether esophagitis present  Plan: HGA1C (HEMOGLOBIN A1C WITH EST AVG GLUCOSE),         MICROALBUMIN/CREATININE RATIO, URINE, RANDOM,         CBC, COMPREHENSIVE METABOLIC PNL, FASTING,         LIPID PANEL, MAGNESIUM, THYROID STIMULATING         HORMONE (SENSITIVE TSH), VITAMIN D 25 TOTAL,         VITAMIN B12    (I48.0) PAF (paroxysmal atrial fibrillation) (CMS HCC)  Plan: HGA1C (HEMOGLOBIN A1C WITH EST AVG GLUCOSE),         MICROALBUMIN/CREATININE RATIO, URINE, RANDOM,         CBC, COMPREHENSIVE METABOLIC PNL, FASTING,         LIPID PANEL, MAGNESIUM, THYROID STIMULATING         HORMONE (SENSITIVE TSH), VITAMIN D 25 TOTAL,         VITAMIN B12    (F17.210) Tobacco dependence due to cigarettes  Plan: HGA1C (HEMOGLOBIN A1C WITH EST AVG GLUCOSE),         MICROALBUMIN/CREATININE RATIO, URINE, RANDOM,         CBC, COMPREHENSIVE METABOLIC PNL, FASTING,         LIPID PANEL, MAGNESIUM, THYROID STIMULATING         HORMONE (SENSITIVE TSH), VITAMIN D 25 TOTAL,         VITAMIN B12    (E53.8) B12 deficiency  Plan: HGA1C (HEMOGLOBIN A1C WITH EST AVG GLUCOSE),          MICROALBUMIN/CREATININE RATIO, URINE, RANDOM,         CBC, COMPREHENSIVE METABOLIC PNL, FASTING,         LIPID PANEL, MAGNESIUM, THYROID STIMULATING         HORMONE (SENSITIVE TSH), VITAMIN D 25 TOTAL,         VITAMIN B12    (E55.9) Vitamin D deficiency  Plan: HGA1C (HEMOGLOBIN A1C WITH EST AVG GLUCOSE),         MICROALBUMIN/CREATININE RATIO, URINE, RANDOM,         CBC, COMPREHENSIVE METABOLIC PNL, FASTING,         LIPID PANEL, MAGNESIUM, THYROID STIMULATING         HORMONE (SENSITIVE TSH), VITAMIN D 25 TOTAL,         VITAMIN B12    (D64.9) Low hemoglobin  Plan: FERRITIN, IRON TRANSFERRIN AND TIBC    (H61.23) Excessive cerumen in both ear canals   Advised to follow up for bilateral ear irrigation, patient reports he will schedule a nurse visit when he has time.       Labs drawn today.  Continue current medications.  Advised a low-fat/low sodium diet, advised 150  minutes of scheduled weekly physical activity as tolerated.  Advised to maintain a healthy weight.   Return in about 3 months (around 04/16/2022) for routine visit; schedule medicare wellness visit.    Jodi Mourning, NP-C     Portions of this note may be dictated using voice recognition software or a dictation service. Variances in spelling and vocabulary are possible and unintentional. Not all errors are caught/corrected. Please notify the [facility] if any discrepancies are noted or if the meaning of any statement is not clear.

## 2022-01-15 LAB — HGA1C (HEMOGLOBIN A1C WITH EST AVG GLUCOSE): HEMOGLOBIN A1C: 6.6 % — ABNORMAL HIGH (ref 4.0–6.0)

## 2022-01-19 ENCOUNTER — Other Ambulatory Visit (RURAL_HEALTH_CENTER): Payer: Self-pay | Admitting: Family

## 2022-01-19 DIAGNOSIS — N183 Chronic kidney disease, stage 3 unspecified (CMS HCC): Secondary | ICD-10-CM

## 2022-01-19 DIAGNOSIS — E538 Deficiency of other specified B group vitamins: Secondary | ICD-10-CM

## 2022-01-19 DIAGNOSIS — E559 Vitamin D deficiency, unspecified: Secondary | ICD-10-CM

## 2022-01-19 DIAGNOSIS — E119 Type 2 diabetes mellitus without complications: Secondary | ICD-10-CM

## 2022-01-19 DIAGNOSIS — D509 Iron deficiency anemia, unspecified: Secondary | ICD-10-CM

## 2022-04-14 ENCOUNTER — Encounter (RURAL_HEALTH_CENTER): Payer: Self-pay | Admitting: Family

## 2022-04-14 ENCOUNTER — Ambulatory Visit: Payer: Medicare Other | Attending: Family | Admitting: Family

## 2022-04-14 ENCOUNTER — Ambulatory Visit (RURAL_HEALTH_CENTER): Payer: Medicare Other | Attending: Family | Admitting: Family

## 2022-04-14 ENCOUNTER — Other Ambulatory Visit: Payer: Self-pay

## 2022-04-14 VITALS — BP 141/51 | HR 68 | Temp 97.0°F | Resp 17 | Ht 68.0 in | Wt 210.0 lb

## 2022-04-14 DIAGNOSIS — E559 Vitamin D deficiency, unspecified: Secondary | ICD-10-CM | POA: Insufficient documentation

## 2022-04-14 DIAGNOSIS — E538 Deficiency of other specified B group vitamins: Secondary | ICD-10-CM | POA: Insufficient documentation

## 2022-04-14 DIAGNOSIS — M1711 Unilateral primary osteoarthritis, right knee: Secondary | ICD-10-CM | POA: Insufficient documentation

## 2022-04-14 DIAGNOSIS — F17220 Nicotine dependence, chewing tobacco, uncomplicated: Secondary | ICD-10-CM | POA: Insufficient documentation

## 2022-04-14 DIAGNOSIS — Z23 Encounter for immunization: Secondary | ICD-10-CM | POA: Insufficient documentation

## 2022-04-14 DIAGNOSIS — N183 Chronic kidney disease, stage 3 unspecified: Secondary | ICD-10-CM | POA: Insufficient documentation

## 2022-04-14 DIAGNOSIS — K219 Gastro-esophageal reflux disease without esophagitis: Secondary | ICD-10-CM | POA: Insufficient documentation

## 2022-04-14 DIAGNOSIS — D508 Other iron deficiency anemias: Secondary | ICD-10-CM | POA: Insufficient documentation

## 2022-04-14 DIAGNOSIS — Z7901 Long term (current) use of anticoagulants: Secondary | ICD-10-CM | POA: Insufficient documentation

## 2022-04-14 DIAGNOSIS — I480 Paroxysmal atrial fibrillation: Secondary | ICD-10-CM | POA: Insufficient documentation

## 2022-04-14 DIAGNOSIS — J449 Chronic obstructive pulmonary disease, unspecified: Secondary | ICD-10-CM | POA: Insufficient documentation

## 2022-04-14 DIAGNOSIS — E782 Mixed hyperlipidemia: Secondary | ICD-10-CM | POA: Insufficient documentation

## 2022-04-14 DIAGNOSIS — E669 Obesity, unspecified: Secondary | ICD-10-CM | POA: Insufficient documentation

## 2022-04-14 DIAGNOSIS — I10 Essential (primary) hypertension: Secondary | ICD-10-CM | POA: Insufficient documentation

## 2022-04-14 DIAGNOSIS — E1122 Type 2 diabetes mellitus with diabetic chronic kidney disease: Secondary | ICD-10-CM | POA: Insufficient documentation

## 2022-04-14 DIAGNOSIS — Z6830 Body mass index (BMI) 30.0-30.9, adult: Secondary | ICD-10-CM | POA: Insufficient documentation

## 2022-04-14 LAB — COMPREHENSIVE METABOLIC PNL, FASTING
ALBUMIN/GLOBULIN RATIO: 1.4 (ref 0.8–1.4)
ALBUMIN: 4.7 g/dL (ref 3.5–5.7)
ALKALINE PHOSPHATASE: 61 U/L (ref 34–104)
ALT (SGPT): 23 U/L (ref 7–52)
ANION GAP: 6 mmol/L (ref 4–13)
AST (SGOT): 22 U/L (ref 13–39)
BILIRUBIN TOTAL: 0.5 mg/dL (ref 0.3–1.2)
BUN/CREA RATIO: 10 (ref 6–22)
BUN: 17 mg/dL (ref 7–25)
CALCIUM, CORRECTED: 9.7 mg/dL (ref 8.9–10.8)
CALCIUM: 10.3 mg/dL (ref 8.6–10.3)
CHLORIDE: 100 mmol/L (ref 98–107)
CO2 TOTAL: 26 mmol/L (ref 21–31)
CREATININE: 1.62 mg/dL — ABNORMAL HIGH (ref 0.60–1.30)
ESTIMATED GFR: 43 mL/min/{1.73_m2} — ABNORMAL LOW (ref >59)
GLOBULIN: 3.4 (ref 2.9–5.4)
GLUCOSE: 130 mg/dL — ABNORMAL HIGH (ref 74–109)
OSMOLALITY, CALCULATED: 268 mOsm/kg — ABNORMAL LOW (ref 270–290)
POTASSIUM: 4.1 mmol/L (ref 3.5–5.1)
PROTEIN TOTAL: 8.1 g/dL (ref 6.4–8.9)
SODIUM: 132 mmol/L — ABNORMAL LOW (ref 136–145)

## 2022-04-14 LAB — THYROID STIMULATING HORMONE (SENSITIVE TSH): TSH: 3.92 u[IU]/mL (ref 0.450–5.330)

## 2022-04-14 LAB — IRON TRANSFERRIN AND TIBC
IRON (TRANSFERRIN) SATURATION: 9 % — ABNORMAL LOW (ref 20–50)
IRON: 41 ug/dL — ABNORMAL LOW (ref 50–212)
TOTAL IRON BINDING CAPACITY: 477 ug/dL — ABNORMAL HIGH (ref 250–450)
TRANSFERRIN: 341 mg/dL (ref 203–362)
UIBC: 436 ug/dL — ABNORMAL HIGH (ref 130–375)

## 2022-04-14 LAB — LIPID PANEL
CHOL/HDL RATIO: 3.1
CHOLESTEROL: 113 mg/dL (ref <200)
HDL CHOL: 36 mg/dL (ref 23–92)
LDL CALC: 39 mg/dL (ref 0–100)
TRIGLYCERIDES: 191 mg/dL — ABNORMAL HIGH (ref <=150)
VLDL CALC: 38 mg/dL (ref 0–50)

## 2022-04-14 LAB — MICROALBUMIN/CREATININE RATIO, URINE, RANDOM
CREATININE RANDOM URINE: 199 mg/dL — ABNORMAL HIGH (ref 11–26)
MICROALBUMIN RANDOM URINE: 1.2 mg/dL
MICROALBUMIN/CREATININE RATIO RANDOM URINE: 6 mg/g

## 2022-04-14 LAB — MAGNESIUM: MAGNESIUM: 2 mg/dL (ref 1.9–2.7)

## 2022-04-14 LAB — CBC
HCT: 40.8 % (ref 36.7–47.1)
HGB: 13.5 g/dL (ref 12.5–16.3)
MCH: 26.9 pg (ref 23.8–33.4)
MCHC: 33.1 g/dL (ref 32.5–36.3)
MCV: 81.2 fL (ref 73.0–96.2)
MPV: 9.3 fL (ref 7.4–11.4)
PLATELETS: 378 10*3/uL (ref 140–440)
RBC: 5.03 10*6/uL (ref 4.06–5.63)
RDW: 17.5 % — ABNORMAL HIGH (ref 12.1–16.2)
WBC: 10 10*3/uL (ref 3.6–10.2)

## 2022-04-14 LAB — VITAMIN B12: VITAMIN B 12: 443 pg/mL (ref 180–914)

## 2022-04-14 LAB — FERRITIN: FERRITIN: 36 ng/mL (ref 11–336)

## 2022-04-14 LAB — VITAMIN D 25 TOTAL: VITAMIN D: 42 ng/mL (ref 30–100)

## 2022-04-14 LAB — HGA1C (HEMOGLOBIN A1C WITH EST AVG GLUCOSE): HEMOGLOBIN A1C: 6.1 % — ABNORMAL HIGH (ref 4.0–6.0)

## 2022-04-14 NOTE — Assessment & Plan Note (Signed)
Continue rosuvastatin 20 mg daily.  Tolerating well.

## 2022-04-14 NOTE — Assessment & Plan Note (Signed)
Continue vitamin B12 1000 mcg daily.

## 2022-04-14 NOTE — Assessment & Plan Note (Signed)
Patient is no longer taking vitamin-D supplements.

## 2022-04-14 NOTE — Assessment & Plan Note (Signed)
Symptoms well controlled with omeprazole 40 mg daily and famotidine 40mg  daily.

## 2022-04-14 NOTE — Progress Notes (Signed)
FAMILY MEDICINE, [place]  [place] [address]  Operated by [facility]     Name: Frank Barton MRN:  [id number]   Date of Birth: November 29, 1945 Age: 76 y.o.   Date: 04/14/2022  Time: 22:02     Provider: Jodi Mourning, NP-C    Reason for visit: Follow Up 3 Months      History of Present Illness:  Frank Barton is a 76 y.o. male presenting with chronic disease management.    Patient complains of right knee joint pain.  Patient reports he is having difficulty walking due to worsening pain.  Denies fall or injury.  Denies numbness or tingling.  Denies radiating pain.  Patient ambulatory without assistance.  Denies prior surgery to right knee.  History of osteoarthritis.    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 Royce Macadamia.      Type 2 diabetes mellitus with stage 3 chronic kidney disease, without long-term current use of insulin (CMS [place]) 10/14/2021    Essential hypertension 10/14/2021    Mixed hyperlipidemia 10/14/2021    Gastroesophageal reflux disease 10/14/2021    COPD (chronic obstructive pulmonary disease) (CMS HCC) 10/14/2021    B12 deficiency 10/14/2021    Vitamin D deficiency 10/14/2021    Obesity (BMI 30.0-34.9) 10/14/2021    PAF (paroxysmal atrial fibrillation) (CMS HCC) 10/14/2021       Historical Data    Past Medical History:  Past Medical History:   Diagnosis Date    Allergic rhinitis     Anxiety state     Asthma     Atrial fibrillation (CMS HCC)     Chronic obstructive airway disease (CMS HCC)     CKD (chronic kidney disease)     stage 3    Diabetes mellitus, type 2 (CMS HCC)      Esophageal reflux     Excessive cerumen in both ear canals     Hypercholesterolemia     Hypertension     Hypokalemia     Hyponatremia     Iron deficiency anemia, unspecified     Leaky heart valve     Vitamin B12 deficiency     Vitamin D deficiency      Past Surgical History:  Past Surgical History:   Procedure Laterality Date    HX CAROTID ENDARTERECTOMY      HX CHOLECYSTECTOMY       Allergies:  Allergies   Allergen Reactions    Latex, Natural Rubber Shortness of Breath    Levofloxacin Hives/ Urticaria and  Other Adverse Reaction (Add comment)     Hives  Was taking Levaquin, Miralax and Spiriva at same time.  MDs unsure of which caused reaction      Polyethylene Glycol 3350 Hives/ Urticaria and  Other Adverse Reaction (Add comment)     Hives  Was taking Levaquin, Miralax and Spiriva at same time.  MDs unsure of which caused reaction      Tiotropium Bromide Hives/ Urticaria and  Other Adverse Reaction (Add comment)     hives  Was  taking Levaquin, Miralax and Spiriva at same time.  MDs unsure of which caused reaction      Polyethylene Glycol Hives/ Urticaria    Polyethylene Glycol 300 Hives/ Urticaria     Medications:  Current Outpatient Medications   Medication Sig    amLODIPine (NORVASC) 5 mg Oral Tablet Take 1 Tablet (5 mg total) by mouth Once a day for 90 days    candesartan (ATACAND) 32 mg Oral Tablet Take 1 Tablet (32 mg total) by mouth Once a day    cyanocobalamin (VITAMIN B-12) 500 mcg Oral Tablet Take 1 Tablet (500 mcg total) by mouth    ELIQUIS 5 mg Oral Tablet Take 1 Tablet (5 mg total) by mouth Twice daily    famotidine (PEPCID) 40 mg Oral Tablet Take 1 Tablet (40 mg total) by mouth Every evening    ferrous sulfate (FEOSOL) 325 mg (65 mg iron) Oral Tablet Take 1 Tablet (325 mg total) by mouth    fluticasone propionate (FLONASE) 50 mcg/actuation Nasal Spray, Suspension     magnesium oxide (MAG-OX) 400 mg Oral Tablet Take 1 Tablet (400 mg total) by mouth Twice daily    metoprolol succinate (TOPROL-XL) 50 mg  Oral Tablet Sustained Release 24 hr Take 1 Tablet (50 mg total) by mouth Once a day for 90 days    montelukast (SINGULAIR) 10 mg Oral Tablet Take 1 Tablet (10 mg total) by mouth Once a day for 90 days    MULTAQ 400 mg Oral Tablet Take 1 Tablet (400 mg total) by mouth Twice daily with food    nitroGLYCERIN (NITROSTAT) 0.4 mg Sublingual Tablet, Sublingual Place 1 Tablet (0.4 mg total) under the tongue Every morning after breakfast    omeprazole (PRILOSEC) 40 mg Oral Capsule, Delayed Release(E.C.) Take 1 Capsule (40 mg total) by mouth Twice daily    QVAR REDIHALER 80 mcg/actuation Inhalation oral inhaler     rosuvastatin (CRESTOR) 20 mg Oral Tablet Take 1 Tablet (20 mg total) by mouth Once a day for 90 days     Family History:  Family Medical History:       Problem Relation (Age of Onset)    CKD Father    Cancer Mother    Diabetes Mother, Brother    Heart Attack Father    Heart Disease Brother            Social History:  Social History     Socioeconomic History    Marital status: Married   Tobacco Use    Smoking status: Never    Smokeless tobacco: Current     Types: Chew   Vaping Use    Vaping Use: Never used   Substance and Sexual Activity    Alcohol use: Never    Drug use: Never    Sexual activity: Not Currently     Social Determinants of Health     Financial Resource Strain: Low Risk  (04/14/2022)    Financial Resource Strain     SDOH Financial: No   Transportation Needs: Low Risk  (01/14/2022)    Transportation Needs     SDOH Transportation: No   Social Connections: Low Risk  (01/14/2022)    Social Connections     SDOH Social Isolation: 5 or more times a week   Intimate Partner Violence: Low Risk  (04/14/2022)    Intimate Partner Violence     SDOH Domestic Violence: No   Housing Stability: Low Risk  (04/14/2022)    Housing Stability     SDOH  Housing Situation: I have housing.     SDOH Housing Worry: No           Review of Systems:  Any pertinent Review of Systems as addressed in the HPI above.    Physical Exam:  Vital  Signs:  Vitals:    04/14/22 1110   BP: (!) 141/51   Pulse: 68   Resp: 17   Temp: 36.1 C (97 F)   SpO2: 95%   Weight: 95.3 kg (210 lb)   Height: 1.727 m (5' 8")   BMI: 32     Physical Exam  Constitutional:       Appearance: Normal appearance. He is normal weight.   HENT:      Right Ear: Tympanic membrane, ear canal and external ear normal.      Left Ear: Tympanic membrane, ear canal and external ear normal.   Eyes:      Extraocular Movements: Extraocular movements intact.      Conjunctiva/sclera: Conjunctivae normal.      Pupils: Pupils are equal, round, and reactive to light.   Cardiovascular:      Rate and Rhythm: Normal rate and regular rhythm.      Pulses: Normal pulses.      Heart sounds: Normal heart sounds.   Pulmonary:      Effort: Pulmonary effort is normal.      Breath sounds: Normal breath sounds.   Abdominal:      General: Bowel sounds are normal.      Palpations: Abdomen is soft.   Musculoskeletal:         General: Normal range of motion.      Cervical back: Neck supple.      Left knee: Normal.      Comments: Consent signed for  right knee intra-articular injection given today.   using 21 gauge needle, 2 cc 1% lidocaine and Kenalog 40 mg IM in a 5cc syringe.  Patient was positioned sitting on table with right knee in 90 deg flexion position.  Using sterile technique, site cleaned with Betadine and alcohol and anesthetized with ethyl chloride.  Right knee was injected using anterolateral approach into right knee anterolateral soft spot above tibia tubercle to the left of the patellar tendon using using walker joint space.  Though resistance was met.  Knee was injected without complication.  Post reduction bleeding controlled and bandage applied to site.  Full  range-of-motion noted post-injection..  Patient was ambulated by nurse in the whole way without complication prior to discharge.  Patient tolerated procedure well.   Skin:     General: Skin is warm and dry.   Neurological:      General: No focal  deficit present.      Mental Status: He is alert and oriented to person, place, and time. Mental status is at baseline.      Cranial Nerves: Cranial nerves 2-12 are intact.      Motor: Motor function is intact.      Coordination: Coordination is intact.      Gait: Gait is intact.   Psychiatric:         Mood and Affect: Mood normal.         Behavior: Behavior normal. Behavior is cooperative.         Thought Content: Thought content normal.         Cognition and Memory: Cognition normal.         Judgment: Judgment normal.  Assessment/Plan:  (E11.22,  N18.30) Type 2 diabetes mellitus with stage 3 chronic kidney disease, without long-term current use of insulin (CMS HCC)  (primary encounter diagnosis)  Plan: FERRITIN, IRON TRANSFERRIN AND TIBC, HGA1C         (HEMOGLOBIN A1C WITH EST AVG GLUCOSE),         MICROALBUMIN/CREATININE RATIO, URINE, RANDOM,         CBC, COMPREHENSIVE METABOLIC PNL, FASTING,         LIPID PANEL, MAGNESIUM, THYROID STIMULATING         HORMONE (SENSITIVE TSH), VITAMIN D 25 TOTAL,         VITAMIN B12, POCT Occult Blood for Stool x3         (Home Kit)    (J44.9) COPD (chronic obstructive pulmonary disease) (CMS HCC)  Plan: FERRITIN, IRON TRANSFERRIN AND TIBC, HGA1C         (HEMOGLOBIN A1C WITH EST AVG GLUCOSE),         MICROALBUMIN/CREATININE RATIO, URINE, RANDOM,         CBC, COMPREHENSIVE METABOLIC PNL, FASTING,         LIPID PANEL, MAGNESIUM, THYROID STIMULATING         HORMONE (SENSITIVE TSH), VITAMIN D 25 TOTAL,         VITAMIN B12, POCT Occult Blood for Stool x3         (Home Kit)    (I48.0) PAF (paroxysmal atrial fibrillation) (CMS HCC)  Plan: FERRITIN, IRON TRANSFERRIN AND TIBC, HGA1C         (HEMOGLOBIN A1C WITH EST AVG GLUCOSE),         MICROALBUMIN/CREATININE RATIO, URINE, RANDOM,         CBC, COMPREHENSIVE METABOLIC PNL, FASTING,         LIPID PANEL, MAGNESIUM, THYROID STIMULATING         HORMONE (SENSITIVE TSH), VITAMIN D 25 TOTAL,         VITAMIN B12, POCT Occult Blood for  Stool x3         (Home Kit)    (I10) Essential hypertension  Plan: FERRITIN, IRON TRANSFERRIN AND TIBC, HGA1C         (HEMOGLOBIN A1C WITH EST AVG GLUCOSE),         MICROALBUMIN/CREATININE RATIO, URINE, RANDOM,         CBC, COMPREHENSIVE METABOLIC PNL, FASTING,         LIPID PANEL, MAGNESIUM, THYROID STIMULATING         HORMONE (SENSITIVE TSH), VITAMIN D 25 TOTAL,         VITAMIN B12, POCT Occult Blood for Stool x3         (Home Kit)    (E78.2) Mixed hyperlipidemia  Plan: FERRITIN, IRON TRANSFERRIN AND TIBC, HGA1C         (HEMOGLOBIN A1C WITH EST AVG GLUCOSE),         MICROALBUMIN/CREATININE RATIO, URINE, RANDOM,         CBC, COMPREHENSIVE METABOLIC PNL, FASTING,         LIPID PANEL, MAGNESIUM, THYROID STIMULATING         HORMONE (SENSITIVE TSH), VITAMIN D 25 TOTAL,         VITAMIN B12, POCT Occult Blood for Stool x3         (Home Kit)    (K21.9) Gastroesophageal reflux disease  Plan: FERRITIN, IRON TRANSFERRIN AND TIBC, HGA1C         (HEMOGLOBIN A1C WITH EST AVG GLUCOSE),  MICROALBUMIN/CREATININE RATIO, URINE, RANDOM,         CBC, COMPREHENSIVE METABOLIC PNL, FASTING,         LIPID PANEL, MAGNESIUM, THYROID STIMULATING         HORMONE (SENSITIVE TSH), VITAMIN D 25 TOTAL,         VITAMIN B12, POCT Occult Blood for Stool x3         (Home Kit)    (N18.30) CKD (chronic kidney disease) stage 3, GFR 30-59 ml/min (CMS HCC)  Plan: FERRITIN, IRON TRANSFERRIN AND TIBC, HGA1C         (HEMOGLOBIN A1C WITH EST AVG GLUCOSE),         MICROALBUMIN/CREATININE RATIO, URINE, RANDOM,         CBC, COMPREHENSIVE METABOLIC PNL, FASTING,         LIPID PANEL, MAGNESIUM, THYROID STIMULATING         HORMONE (SENSITIVE TSH), VITAMIN D 25 TOTAL,         VITAMIN B12, POCT Occult Blood for Stool x3         (Home Kit)    (F17.220) 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  Plan: FERRITIN, IRON TRANSFERRIN AND TIBC, HGA1C         (HEMOGLOBIN A1C WITH EST AVG GLUCOSE),         MICROALBUMIN/CREATININE RATIO, URINE, RANDOM,         CBC,  COMPREHENSIVE METABOLIC PNL, FASTING,         LIPID PANEL, MAGNESIUM, THYROID STIMULATING         HORMONE (SENSITIVE TSH), VITAMIN D 25 TOTAL,         VITAMIN B12, POCT Occult Blood for Stool x3         (Home Kit)    (E66.9) Obesity (BMI 30.0-34.9)  Plan: FERRITIN, IRON TRANSFERRIN AND TIBC, HGA1C         (HEMOGLOBIN A1C WITH EST AVG GLUCOSE),         MICROALBUMIN/CREATININE RATIO, URINE, RANDOM,         CBC, COMPREHENSIVE METABOLIC PNL, FASTING,         LIPID PANEL, MAGNESIUM, THYROID STIMULATING         HORMONE (SENSITIVE TSH), VITAMIN D 25 TOTAL,         VITAMIN B12, POCT Occult Blood for Stool x3         (Home Kit)    (E83.42) Hypomagnesemia  Plan: FERRITIN, IRON TRANSFERRIN AND TIBC, HGA1C         (HEMOGLOBIN A1C WITH EST AVG GLUCOSE),         MICROALBUMIN/CREATININE RATIO, URINE, RANDOM,         CBC, COMPREHENSIVE METABOLIC PNL, FASTING,         LIPID PANEL, MAGNESIUM, THYROID STIMULATING         HORMONE (SENSITIVE TSH), VITAMIN D 25 TOTAL,         VITAMIN B12, POCT Occult Blood for Stool x3         (Home Kit)    (D50.8) Iron deficiency anemia secondary to inadequate dietary iron intake  Plan: FERRITIN, IRON TRANSFERRIN AND TIBC, HGA1C         (HEMOGLOBIN A1C WITH EST AVG GLUCOSE),         MICROALBUMIN/CREATININE RATIO, URINE, RANDOM,         CBC, COMPREHENSIVE METABOLIC PNL, FASTING,         LIPID PANEL, MAGNESIUM, THYROID STIMULATING         HORMONE (SENSITIVE TSH), VITAMIN D 25  TOTAL,         VITAMIN B12, POCT Occult Blood for Stool x3         (Home Kit), POCT Occult Blood for Stool x3         (Home Kit)    (E55.9) Vitamin D deficiency  Plan: FERRITIN, IRON TRANSFERRIN AND TIBC, HGA1C         (HEMOGLOBIN A1C WITH EST AVG GLUCOSE),         MICROALBUMIN/CREATININE RATIO, URINE, RANDOM,         CBC, COMPREHENSIVE METABOLIC PNL, FASTING,         LIPID PANEL, MAGNESIUM, THYROID STIMULATING         HORMONE (SENSITIVE TSH), VITAMIN D 25 TOTAL,         VITAMIN B12, POCT Occult Blood for Stool x3         (Home  Kit)    (E53.8) B12 deficiency  Plan: FERRITIN, IRON TRANSFERRIN AND TIBC, HGA1C         (HEMOGLOBIN A1C WITH EST AVG GLUCOSE),         MICROALBUMIN/CREATININE RATIO, URINE, RANDOM,         CBC, COMPREHENSIVE METABOLIC PNL, FASTING,         LIPID PANEL, MAGNESIUM, THYROID STIMULATING         HORMONE (SENSITIVE TSH), VITAMIN D 25 TOTAL,         VITAMIN B12, POCT Occult Blood for Stool x3         (Home Kit)    (M17.11) Osteoarthritis of right knee    (Z79.01) Long term current use of anticoagulant       Problem List Items Addressed This Visit          Cardiovascular System    Essential hypertension     BP slightly elevated today.  Advised to monitor blood pressure daily and return to clinic if blood pressures greater than 130/80.  Continue a low-sodium diet.  Avoid over-the-counter NSAIDs.  Continue current medications.         Relevant Orders    FERRITIN (Completed)    IRON TRANSFERRIN AND TIBC (Completed)    HGA1C (HEMOGLOBIN A1C WITH EST AVG GLUCOSE)    MICROALBUMIN/CREATININE RATIO, URINE, RANDOM    CBC    COMPREHENSIVE METABOLIC PNL, FASTING    LIPID PANEL    MAGNESIUM    THYROID STIMULATING HORMONE (SENSITIVE TSH)    VITAMIN D 25 TOTAL    VITAMIN B12    POCT Occult Blood for Stool x3 (Home Kit)    Mixed hyperlipidemia     Continue rosuvastatin 20 mg daily.  Tolerating well.         Relevant Orders    FERRITIN (Completed)    IRON TRANSFERRIN AND TIBC (Completed)    HGA1C (HEMOGLOBIN A1C WITH EST AVG GLUCOSE)    MICROALBUMIN/CREATININE RATIO, URINE, RANDOM    CBC    COMPREHENSIVE METABOLIC PNL, FASTING    LIPID PANEL    MAGNESIUM    THYROID STIMULATING HORMONE (SENSITIVE TSH)    VITAMIN D 25 TOTAL    VITAMIN B12    POCT Occult Blood for Stool x3 (Home Kit)    PAF (paroxysmal atrial fibrillation) (CMS HCC)     Continue Eliquis 5 mg b.i.d..         Relevant Orders    FERRITIN (Completed)    IRON TRANSFERRIN AND TIBC (Completed)    HGA1C (HEMOGLOBIN A1C WITH EST AVG GLUCOSE)    MICROALBUMIN/CREATININE RATIO, URINE,  RANDOM    CBC    COMPREHENSIVE METABOLIC PNL, FASTING    LIPID PANEL    MAGNESIUM    THYROID STIMULATING HORMONE (SENSITIVE TSH)    VITAMIN D 25 TOTAL    VITAMIN B12    POCT Occult Blood for Stool x3 (Home Kit)       Respiratory    COPD (chronic obstructive pulmonary disease) (CMS HCC)     Continue QVAR inhaler daily.         Relevant Orders    FERRITIN (Completed)    IRON TRANSFERRIN AND TIBC (Completed)    HGA1C (HEMOGLOBIN A1C WITH EST AVG GLUCOSE)    MICROALBUMIN/CREATININE RATIO, URINE, RANDOM    CBC    COMPREHENSIVE METABOLIC PNL, FASTING    LIPID PANEL    MAGNESIUM    THYROID STIMULATING HORMONE (SENSITIVE TSH)    VITAMIN D 25 TOTAL    VITAMIN B12    POCT Occult Blood for Stool x3 (Home Kit)       Nephrology    Type 2 diabetes mellitus with stage 3 chronic kidney disease, without long-term current use of insulin (CMS HCC) - Primary     Managing with diet and exercise.  Patient reports he is no longer taking diabetic medications.         Relevant Orders    FERRITIN (Completed)    IRON TRANSFERRIN AND TIBC (Completed)    HGA1C (HEMOGLOBIN A1C WITH EST AVG GLUCOSE)    MICROALBUMIN/CREATININE RATIO, URINE, RANDOM    CBC    COMPREHENSIVE METABOLIC PNL, FASTING    LIPID PANEL    MAGNESIUM    THYROID STIMULATING HORMONE (SENSITIVE TSH)    VITAMIN D 25 TOTAL    VITAMIN B12    POCT Occult Blood for Stool x3 (Home Kit)    CKD (chronic kidney disease) stage 3, GFR 30-59 ml/min (CMS HCC)    Relevant Orders    FERRITIN (Completed)    IRON TRANSFERRIN AND TIBC (Completed)    HGA1C (HEMOGLOBIN A1C WITH EST AVG GLUCOSE)    MICROALBUMIN/CREATININE RATIO, URINE, RANDOM    CBC    COMPREHENSIVE METABOLIC PNL, FASTING    LIPID PANEL    MAGNESIUM    THYROID STIMULATING HORMONE (SENSITIVE TSH)    VITAMIN D 25 TOTAL    VITAMIN B12    POCT Occult Blood for Stool x3 (Home Kit)       Digestive    Gastroesophageal reflux disease     Symptoms well controlled with omeprazole 40 mg daily and famotidine 40m daily.         Relevant Orders     FERRITIN (Completed)    IRON TRANSFERRIN AND TIBC (Completed)    HGA1C (HEMOGLOBIN A1C WITH EST AVG GLUCOSE)    MICROALBUMIN/CREATININE RATIO, URINE, RANDOM    CBC    COMPREHENSIVE METABOLIC PNL, FASTING    LIPID PANEL    MAGNESIUM    THYROID STIMULATING HORMONE (SENSITIVE TSH)    VITAMIN D 25 TOTAL    VITAMIN B12    POCT Occult Blood for Stool x3 (Home Kit)       Endocrine    B12 deficiency     Continue vitamin B12 1000 mcg daily         Relevant Orders    FERRITIN (Completed)    IRON TRANSFERRIN AND TIBC (Completed)    HGA1C (HEMOGLOBIN A1C WITH EST AVG GLUCOSE)    MICROALBUMIN/CREATININE RATIO, URINE, RANDOM    CBC    COMPREHENSIVE METABOLIC  PNL, FASTING    LIPID PANEL    MAGNESIUM    THYROID STIMULATING HORMONE (SENSITIVE TSH)    VITAMIN D 25 TOTAL    VITAMIN B12    POCT Occult Blood for Stool x3 (Home Kit)    Vitamin D deficiency     Patient is no longer taking vitamin-D supplements         Relevant Orders    FERRITIN (Completed)    IRON TRANSFERRIN AND TIBC (Completed)    HGA1C (HEMOGLOBIN A1C WITH EST AVG GLUCOSE)    MICROALBUMIN/CREATININE RATIO, URINE, RANDOM    CBC    COMPREHENSIVE METABOLIC PNL, FASTING    LIPID PANEL    MAGNESIUM    THYROID STIMULATING HORMONE (SENSITIVE TSH)    VITAMIN D 25 TOTAL    VITAMIN B12    POCT Occult Blood for Stool x3 (Home Kit)       Hematologic/Lymphatic    Iron deficiency anemia     Continue ferrous sulfate 325 mg b.i.d..  Patient declined Hemoccult today.  Patient agree taking occult cards x3 at home.         Relevant Orders    FERRITIN (Completed)    IRON TRANSFERRIN AND TIBC (Completed)    HGA1C (HEMOGLOBIN A1C WITH EST AVG GLUCOSE)    MICROALBUMIN/CREATININE RATIO, URINE, RANDOM    CBC    COMPREHENSIVE METABOLIC PNL, FASTING    LIPID PANEL    MAGNESIUM    THYROID STIMULATING HORMONE (SENSITIVE TSH)    VITAMIN D 25 TOTAL    VITAMIN B12    POCT Occult Blood for Stool x3 (Home Kit)    POCT Occult Blood for Stool x3 (Home Kit)       Musculoskeletal    Osteoarthritis of  right knee     Right knee intra-articular injection given today.  Patient tolerated well.  See procedure note above.            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  Patient is not interested in tobacco cessation at this         Relevant Orders    FERRITIN (Completed)    IRON TRANSFERRIN AND TIBC (Completed)    HGA1C (HEMOGLOBIN A1C WITH EST AVG GLUCOSE)    MICROALBUMIN/CREATININE RATIO, URINE, RANDOM    CBC    COMPREHENSIVE METABOLIC PNL, FASTING    LIPID PANEL    MAGNESIUM    THYROID STIMULATING HORMONE (SENSITIVE TSH)    VITAMIN D 25 TOTAL    VITAMIN B12    POCT Occult Blood for Stool x3 (Home Kit)       Other    Obesity (BMI 30.0-34.9)     Advised a low-fat/low-sodium diet, advised to 30 minutes daily physical activity as tolerated and to maintain a healthy weight.         Relevant Orders    FERRITIN (Completed)    IRON TRANSFERRIN AND TIBC (Completed)    HGA1C (HEMOGLOBIN A1C WITH EST AVG GLUCOSE)    MICROALBUMIN/CREATININE RATIO, URINE, RANDOM    CBC    COMPREHENSIVE METABOLIC PNL, FASTING    LIPID PANEL    MAGNESIUM    THYROID STIMULATING HORMONE (SENSITIVE TSH)    VITAMIN D 25 TOTAL    VITAMIN B12    POCT Occult Blood for Stool x3 (Home Kit)    Hypomagnesemia     Continue magnesium 400 mg daily.  Relevant Orders    FERRITIN (Completed)    IRON TRANSFERRIN AND TIBC (Completed)    HGA1C (HEMOGLOBIN A1C WITH EST AVG GLUCOSE)    MICROALBUMIN/CREATININE RATIO, URINE, RANDOM    CBC    COMPREHENSIVE METABOLIC PNL, FASTING    LIPID PANEL    MAGNESIUM    THYROID STIMULATING HORMONE (SENSITIVE TSH)    VITAMIN D 25 TOTAL    VITAMIN B12    POCT Occult Blood for Stool x3 (Home Kit)    Long term current use of anticoagulant      Labs drawn today.  Continue current medications.  Advised a low-fat/low sodium diet, advised 150 minutes of scheduled weekly physical activity as tolerated.  Advised to maintain a healthy weight.   Return in about 3 months (around  07/15/2022) for routine visit; schedule medicare wellness visit.    Jodi Mourning, NP-C     Portions of this note may be dictated using voice recognition software or a dictation service. Variances in spelling and vocabulary are possible and unintentional. Not all errors are caught/corrected. Please notify the [facility] if any discrepancies are noted or if the meaning of any statement is not clear.

## 2022-04-14 NOTE — Assessment & Plan Note (Signed)
Discussed tobacco cessation today.  Patient is not interested in tobacco cessation at this

## 2022-04-14 NOTE — Assessment & Plan Note (Addendum)
BP slightly elevated today.  Advised to monitor blood pressure daily and return to clinic if blood pressures greater than 130/80.  Continue a low-sodium diet.  Avoid over-the-counter NSAIDs.  Continue current medications.

## 2022-04-14 NOTE — Assessment & Plan Note (Signed)
Continue Eliquis 5 mg b.i.d..

## 2022-04-14 NOTE — Assessment & Plan Note (Signed)
Advised a low-fat/low-sodium diet, advised to 30 minutes daily physical activity as tolerated and to maintain a healthy weight.

## 2022-04-14 NOTE — Assessment & Plan Note (Signed)
Continue QVAR inhaler daily.

## 2022-04-14 NOTE — Assessment & Plan Note (Signed)
Managing with diet and exercise.  Patient reports he is no longer taking diabetic medications.

## 2022-04-14 NOTE — Assessment & Plan Note (Signed)
Continue magnesium 400mg daily.

## 2022-04-14 NOTE — Assessment & Plan Note (Signed)
Continue ferrous sulfate 325 mg b.i.d..  Patient declined Hemoccult today.  Patient agree taking occult cards x3 at home.

## 2022-04-14 NOTE — Assessment & Plan Note (Signed)
Right knee intra-articular injection given today.  Patient tolerated well.  See procedure note above.

## 2022-05-21 ENCOUNTER — Encounter: Payer: Self-pay | Admitting: Allergy and Immunology

## 2022-05-21 ENCOUNTER — Ambulatory Visit: Payer: Medicare PPO | Admitting: Allergy and Immunology

## 2022-05-21 VITALS — BP 124/78 | HR 67 | Resp 16 | Ht 67.0 in | Wt 215.0 lb

## 2022-05-21 DIAGNOSIS — K219 Gastro-esophageal reflux disease without esophagitis: Secondary | ICD-10-CM | POA: Diagnosis not present

## 2022-05-21 DIAGNOSIS — J4540 Moderate persistent asthma, uncomplicated: Secondary | ICD-10-CM

## 2022-05-21 DIAGNOSIS — J3089 Other allergic rhinitis: Secondary | ICD-10-CM | POA: Diagnosis not present

## 2022-05-21 NOTE — Patient Instructions (Signed)
  1. Continue reflux therapy:   A.  Omeprazole 40 mg - 1 tablet in AM  B.  Famotidine 40 mg - 1 tablet in PM  2. Continue anti-inflammatory therapy for chest:    A. Flovent 110 - 2 inhalations 2 times per day w/ spacer (PA)  B. montelukast 10 mg daily  3. Continue anti-inflammatory therapy for nose:   A. Afrin - 1 spray in single nostril in evening. Alternate nostrils  B. flonase + astelin - 1 spray each in both nostrils in evening  D. flonase + astelin - 1 spray each in both nostrils in morning  4. If Needed:   A. Proventil HFA or Xopenex HFA if needed  B. Zyrtec one tablet one time per day   C. nasal saline multiple times a day   D. Mucinex DM 2 tablets twice a day   5. "Action Plan" for flare up:   A. Increase Flovent to 3 inhalations 3 times per day  B. Use Albuterol HFA if needed  6. Obtain RSV vaccine  7.  Return in 6 months or earlier if problem

## 2022-05-21 NOTE — Progress Notes (Signed)
[name] - [place] - [name] - [name] - [name]   Follow-up Note  Referring Provider: Stormy Fabian, NP Primary Provider: Stormy Fabian, NP Date of Office Visit: 05/21/2022  Subjective:   Bob Newman (DOB: 05-Dec-1945) is a 76 y.o. male who [facility] in re-evaluation of the following:  HPI: Bob Newman returns to this clinic in evaluation of asthma, allergic rhinitis, LPR.  His last visit to this clinic was 19 November 2021.  He has done very well regarding his asthma and rarely uses a short acting bronchodilator while he continues on Qvar 2 inhalations twice a day.  Because of an insurance issue it was suggested that he be changed to Flovent but unfortunately his insurance is now denying the use of Flovent.  They would like for him to use Arnuity but he is intolerant of using a powdered inhaler.  His nose has been doing very well.  He rarely uses any Afrin but he does consistently use Flonase and azelastine.  His reflux is under good control with omeprazole and famotidine.  His nephrologist would like for him to not use these medications.  Red does not think that he can go without using these medications to control his reflux.  His renal function is apparently very stable with a GFR at 45-48.  He has received this years flu vaccine.  He tells me that he had symptomatic bradycardia about 2 months ago and then he had a 7-day heart monitor placed and there may have been a lot of PACs and he was instructed to increase his Toprol.  Allergies as of 05/21/2022       Reactions   Latex Shortness Of Breath   Levaquin [levofloxacin] Hives   Miralax [polyethylene Glycol] Hives   Spiriva Handihaler [tiotropium Bromide Monohydrate] Hives        Medication List    albuterol 108 (90 Base) MCG/ACT inhaler Commonly known as: VENTOLIN HFA 2 inhalations every 4-6 hours as needed   amLODipine 5 MG tablet Commonly known as: NORVASC 5  mg daily.   azelastine 0.1 % nasal spray Commonly known as: ASTELIN Use one spray in each nostril twice daily as directed.   candesartan 32 MG tablet Commonly known as: ATACAND Take 32 mg by mouth daily.   Crestor 20 MG tablet Generic drug: rosuvastatin Take 20 mg by mouth daily.   dronedarone 400 MG tablet Commonly known as: MULTAQ Take 400 mg by mouth 2 (two) times daily with a meal.   Eliquis 5 MG Tabs tablet Generic drug: apixaban   famotidine 40 MG tablet Commonly known as: PEPCID Take 1 tablet (40 mg total) by mouth at bedtime.   ferrous sulfate 325 (65 FE) MG tablet Take by mouth.   fluticasone 110 MCG/ACT inhaler Commonly known as: Flovent HFA Inhale 2 puffs into the lungs 2 (two) times daily. With spacer.   fluticasone 50 MCG/ACT nasal spray Commonly known as: FLONASE One spray in both nostrils in the morning and the evening   IRON PO Take by mouth.   levalbuterol 45 MCG/ACT inhaler Commonly known as: Xopenex HFA Can use two puffs every four to six hours as needed for cough or wheeze.   metoprolol succinate 50 MG 24 hr tablet Commonly known as: TOPROL-XL Take 25 mg by mouth daily.   montelukast 10 MG tablet Commonly known as: SINGULAIR TAKE ONE TABLET ONCE DAILY   nitroGLYCERIN 0.4 MG SL tablet Commonly known as: NITROSTAT  omeprazole 40 MG capsule Commonly known as: PRILOSEC TAKE ONE CAPSULE (40 MG TOTAL) BY MOUTH EVERY MORNING AS DIRECTED.   Qvar RediHaler 80 MCG/ACT inhaler Generic drug: beclomethasone Inhale two doses twice daily to prevent cough or wheeze.  Rinse, gargle, and spit after use.    Past Medical History:  Diagnosis Date   Allergic rhinoconjunctivitis    Asthma    Atrial fibrillation (HCC)    GERD (gastroesophageal reflux disease)    High blood pressure     Past Surgical History:  Procedure Laterality Date   CHOLECYSTECTOMY     THROMBOENDARTERECTOMY Right 09/22/2016   Bob Reaper, MD at [facility]    TONSILLECTOMY      Review of systems negative except as noted in HPI / PMHx or noted below:  Review of Systems  Constitutional: Negative.   HENT: Negative.    Eyes: Negative.   Respiratory: Negative.    Cardiovascular: Negative.   Gastrointestinal: Negative.   Genitourinary: Negative.   Musculoskeletal: Negative.   Skin: Negative.   Neurological: Negative.   Endo/Heme/Allergies: Negative.   Psychiatric/Behavioral: Negative.       Objective:   Vitals:   05/21/22 1110  BP: 124/78  Pulse: 67  Resp: 16  SpO2: 97%   Height: 5\' 7"  (170.2 cm)  Weight: 215 lb (97.5 kg)   Physical Exam Constitutional:      Appearance: He is not diaphoretic.  HENT:     Head: Normocephalic.     Right Ear: Tympanic membrane, ear canal and external ear normal.     Left Ear: Tympanic membrane, ear canal and external ear normal.     Nose: Nose normal. No mucosal edema or rhinorrhea.     Mouth/Throat:     Pharynx: Uvula midline. No oropharyngeal exudate.  Eyes:     Conjunctiva/sclera: Conjunctivae normal.  Neck:     Thyroid: No thyromegaly.     Trachea: Trachea normal. No tracheal tenderness or tracheal deviation.  Cardiovascular:     Rate and Rhythm: Normal rate and regular rhythm.     Heart sounds: Normal heart sounds, S1 normal and S2 normal. No murmur heard. Pulmonary:     Effort: No respiratory distress.     Breath sounds: Normal breath sounds. No stridor. No wheezing or rales.  Lymphadenopathy:     Head:     Right side of head: No tonsillar adenopathy.     Left side of head: No tonsillar adenopathy.     Cervical: No cervical adenopathy.  Skin:    Findings: No erythema or rash.     Nails: There is no clubbing.  Neurological:     Mental Status: He is alert.     Diagnostics:    Spirometry was performed and demonstrated an FEV1 of 2.23 at 84 % of predicted.  Assessment and Plan:   1. Asthma, moderate persistent, well-controlled   2. Other allergic rhinitis   3. LPRD  (laryngopharyngeal reflux disease)    1. Continue reflux therapy:   A.  Omeprazole 40 mg - 1 tablet in AM  B.  Famotidine 40 mg - 1 tablet in PM  2. Continue anti-inflammatory therapy for chest:    A. Flovent 110 - 2 inhalations 2 times per day w/ spacer (PA)  B. montelukast 10 mg daily  3. Continue anti-inflammatory therapy for nose:   A. Afrin - 1 spray in single nostril in evening. Alternate nostrils  B. flonase + astelin - 1 spray each in both nostrils in evening  D. flonase + astelin - 1 spray each in both nostrils in morning  4. If Needed:   A. Proventil HFA or Xopenex HFA if needed  B. Zyrtec one tablet one time per day   C. nasal saline multiple times a day   D. Mucinex DM 2 tablets twice a day   5. "Action Plan" for flare up:   A. Increase Flovent to 3 inhalations 3 times per day  B. Use Albuterol HFA if needed  6. Obtain RSV vaccine  7.  Return in 6 months or earlier if problem  Cerrone is doing very well on his current therapy and he has a good understanding of his disease state and how his medications work and appropriate dosing of his medications.  Will attempt to get a prior authorization for his Flovent as he is intolerant of using a powdered inhaler.  I will see him back in this clinic in 6 months or earlier if there is a problem.  Laurette Schimke, MD Allergy / Immunology [facility]

## 2022-05-25 ENCOUNTER — Encounter: Payer: Self-pay | Admitting: Allergy and Immunology

## 2022-06-16 ENCOUNTER — Telehealth: Payer: Self-pay | Admitting: *Deleted

## 2022-06-16 ENCOUNTER — Other Ambulatory Visit: Payer: Self-pay | Admitting: *Deleted

## 2022-06-16 DIAGNOSIS — M2550 Pain in unspecified joint: Secondary | ICD-10-CM

## 2022-06-16 DIAGNOSIS — M19049 Primary osteoarthritis, unspecified hand: Secondary | ICD-10-CM

## 2022-06-16 NOTE — Telephone Encounter (Signed)
Isaih requested a referral for his joint pain. Per Dr. Neldon Mc, I have sent a referral to [facility]. Oliver has been informed.

## 2022-07-22 ENCOUNTER — Encounter: Payer: Self-pay | Admitting: Allergy and Immunology

## 2022-07-22 ENCOUNTER — Ambulatory Visit: Payer: Medicare PPO | Admitting: Allergy and Immunology

## 2022-07-22 VITALS — BP 118/72 | HR 69 | Resp 16 | Ht 67.0 in | Wt 199.2 lb

## 2022-07-22 DIAGNOSIS — B349 Viral infection, unspecified: Secondary | ICD-10-CM | POA: Diagnosis not present

## 2022-07-22 DIAGNOSIS — J3089 Other allergic rhinitis: Secondary | ICD-10-CM

## 2022-07-22 DIAGNOSIS — J4540 Moderate persistent asthma, uncomplicated: Secondary | ICD-10-CM | POA: Diagnosis not present

## 2022-07-22 DIAGNOSIS — K219 Gastro-esophageal reflux disease without esophagitis: Secondary | ICD-10-CM | POA: Diagnosis not present

## 2022-07-22 MED ORDER — METHYLPREDNISOLONE ACETATE 40 MG/ML IJ SUSP
40.0000 mg | Freq: Once | INTRAMUSCULAR | Status: AC
  Administered 2022-07-22: 40 mg via INTRAMUSCULAR

## 2022-07-22 NOTE — Progress Notes (Unsigned)
[place]   Follow-up Note  Referring Provider: Jodi Mourning, NP Primary Provider: Jodi Mourning, NP Date of Office Visit: 07/22/2022  Subjective:   Bob Newman (DOB: June 25, 1945) is a 77 y.o. male who returns to [facility] on 07/22/2022 in re-evaluation of the following:  HPI: Ato returns to this clinic in evaluation of asthma, allergic rhinitis, LPR.  His last visit to this clinic was 21 May 2022.  He was doing wonderful regarding all of his respiratory tract and reflux issues but he had 2 events that occurred recently.  About 3 weeks ago he got a "shot" into his knee which really helped his knee a lot but within 48 hours he developed very bad diarrhea for approximately 2-1/2 weeks and he lost 24 pounds of weight.  Fortunately, his diarrhea has improved dramatically although his stools are still very soft.  Approximately 5 days ago he started with some nasal congestion and within 24 hours that progressed to blowing and anosmia and overall cough and his chest and some sore throat and he just feels lousy.  His wife is at home with a similar issue when she went to the doctor and had a swab for influenza and a swab for COVID both of which were negative.  Allergies as of 07/22/2022       Reactions   Latex Shortness Of Breath   Levaquin [levofloxacin] Hives   Miralax [polyethylene Glycol] Hives   Spiriva Handihaler [tiotropium Bromide Monohydrate] Hives        Medication List    albuterol 108 (90 Base) MCG/ACT inhaler Commonly known as: VENTOLIN HFA 2 inhalations every 4-6 hours as needed   amLODipine 5 MG tablet Commonly known as: NORVASC 5 mg daily.   azelastine 0.1 % nasal spray Commonly known as: ASTELIN Use one spray in each nostril twice daily as directed.   candesartan 32 MG tablet Commonly known as: ATACAND Take 32 mg by mouth daily.   Crestor 20 MG tablet Generic drug:  rosuvastatin Take 20 mg by mouth daily.   dronedarone 400 MG tablet Commonly known as: MULTAQ Take 400 mg by mouth 2 (two) times daily with a meal.   Eliquis 5 MG Tabs tablet Generic drug: apixaban   famotidine 40 MG tablet Commonly known as: PEPCID Take 1 tablet (40 mg total) by mouth at bedtime.   ferrous sulfate 325 (65 FE) MG tablet Take by mouth.   fluticasone 110 MCG/ACT inhaler Commonly known as: Flovent HFA Inhale 2 puffs into the lungs 2 (two) times daily. With spacer.   fluticasone 50 MCG/ACT nasal spray Commonly known as: FLONASE One spray in both nostrils in the morning and the evening   IRON PO Take by mouth.   levalbuterol 45 MCG/ACT inhaler Commonly known as: Xopenex HFA Can use two puffs every four to six hours as needed for cough or wheeze.   metoprolol succinate 50 MG 24 hr tablet Commonly known as: TOPROL-XL Take 25 mg by mouth daily.   montelukast 10 MG tablet Commonly known as: SINGULAIR TAKE ONE TABLET ONCE DAILY   nitroGLYCERIN 0.4 MG SL tablet Commonly known as: NITROSTAT   omeprazole 40 MG capsule Commonly known as: PRILOSEC TAKE ONE CAPSULE (40 MG TOTAL) BY MOUTH EVERY MORNING AS DIRECTED.   Qvar RediHaler 80 MCG/ACT inhaler Generic drug: beclomethasone Inhale two doses twice daily to prevent cough or wheeze.  Rinse, gargle, and spit after use.  Past Medical History:  Diagnosis Date   Allergic rhinoconjunctivitis    Asthma    Atrial fibrillation (HCC)    GERD (gastroesophageal reflux disease)    High blood pressure     Past Surgical History:  Procedure Laterality Date   CHOLECYSTECTOMY     THROMBOENDARTERECTOMY Right 09/22/2016   Letitia Caul, MD at [place] of systems negative except as noted in HPI / PMHx or noted below:  Review of Systems  Constitutional: Negative.   HENT: Negative.    Eyes: Negative.   Respiratory: Negative.    Cardiovascular: Negative.    Gastrointestinal: Negative.   Genitourinary: Negative.   Musculoskeletal: Negative.   Skin: Negative.   Neurological: Negative.   Endo/Heme/Allergies: Negative.   Psychiatric/Behavioral: Negative.       Objective:   Vitals:   07/22/22 1449  BP: 118/72  Pulse: 69  Resp: 16  SpO2: 95%   Height: 5' 7"$  (170.2 cm)  Weight: 199 lb 3.2 oz (90.4 kg)   Physical Exam Constitutional:      Appearance: He is not diaphoretic.  HENT:     Head: Normocephalic.     Right Ear: Tympanic membrane, ear canal and external ear normal.     Left Ear: Tympanic membrane, ear canal and external ear normal.     Nose: Nose normal. No mucosal edema or rhinorrhea.     Mouth/Throat:     Pharynx: Uvula midline. No oropharyngeal exudate.  Eyes:     Conjunctiva/sclera: Conjunctivae normal.  Neck:     Thyroid: No thyromegaly.     Trachea: Trachea normal. No tracheal tenderness or tracheal deviation.  Cardiovascular:     Rate and Rhythm: Normal rate and regular rhythm.     Heart sounds: Normal heart sounds, S1 normal and S2 normal. No murmur heard. Pulmonary:     Effort: No respiratory distress.     Breath sounds: Normal breath sounds. No stridor. No wheezing or rales.  Lymphadenopathy:     Head:     Right side of head: No tonsillar adenopathy.     Left side of head: No tonsillar adenopathy.     Cervical: No cervical adenopathy.  Skin:    Findings: No erythema or rash.     Nails: There is no clubbing.  Neurological:     Mental Status: He is alert.     Diagnostics: none  Assessment and Plan:   1. Asthma, moderate persistent, well-controlled   2. Other allergic rhinitis   3. LPRD (laryngopharyngeal reflux disease)   4. Viral infection     1. Continue reflux therapy:   A.  Omeprazole 40 mg - 1 tablet in AM  B.  Famotidine 40 mg - 1 tablet in PM  2. Continue anti-inflammatory therapy for chest:    A. Flovent 110 - 2 inhalations 2 times per day w/ spacer (PA)  B. montelukast 10 mg  daily  3. Continue anti-inflammatory therapy for nose:   A. Afrin - 1 spray in single nostril in evening. Alternate nostrils  B. flonase + astelin - 1 spray each in both nostrils in evening  D. flonase + astelin - 1 spray each in both nostrils in morning  4. If Needed:   A. Proventil HFA or Xopenex HFA if needed  B. Zyrtec one tablet one time per day   C. nasal saline multiple times a day   D. Mucinex DM 2 tablets twice a day   5. "  Action Plan" for flare up:   A. Increase Flovent to 3 inhalations 3 times per day  B. Use Albuterol HFA if needed  6. For this episode:   A. Complete Covid test and call us back today  B. Depomedrol 40 mg mg delivered in clinic today C. Start "Action Plan" D. molnupiravir???  7.  Return in 6 months or earlier if problem  If Reinhold is infected with COVID we will give him more molnupiravir.  Obviously he is infected with some form of viral particle that he contracted from his wife this week.  He will utilize the plan noted above and will make a decision about how to proceed pending his response to this plan.  Allena Katz, MD Allergy / Immunology [place]

## 2022-07-22 NOTE — Patient Instructions (Addendum)
  1. Continue reflux therapy:   A.  Omeprazole 40 mg - 1 tablet in AM  B.  Famotidine 40 mg - 1 tablet in PM  2. Continue anti-inflammatory therapy for chest:    A. Flovent 110 - 2 inhalations 2 times per day w/ spacer (PA)  B. montelukast 10 mg daily  3. Continue anti-inflammatory therapy for nose:   A. Afrin - 1 spray in single nostril in evening. Alternate nostrils  B. flonase + astelin - 1 spray each in both nostrils in evening  D. flonase + astelin - 1 spray each in both nostrils in morning  4. If Needed:   A. Proventil HFA or Xopenex HFA if needed  B. Zyrtec one tablet one time per day   C. nasal saline multiple times a day   D. Mucinex DM 2 tablets twice a day   5. "Action Plan" for flare up:   A. Increase Flovent to 3 inhalations 3 times per day  B. Use Albuterol HFA if needed  6. For this episode:   A. Complete Covid test and call us back today  B. Depomedrol 40 mg mg delivered in clinic today C. Start "Action Plan" D. molnupiravir???  7.  Return in 6 months or earlier if problem

## 2022-07-23 ENCOUNTER — Encounter: Payer: Self-pay | Admitting: Allergy and Immunology

## 2022-07-23 ENCOUNTER — Ambulatory Visit (RURAL_HEALTH_CENTER): Payer: Self-pay | Admitting: Family

## 2022-07-23 NOTE — Addendum Note (Signed)
Addended by: Jiles Prows on: 07/23/2022 06:44 AM   Modules accepted: Level of Service

## 2022-07-28 ENCOUNTER — Other Ambulatory Visit (RURAL_HEALTH_CENTER): Payer: Self-pay | Admitting: Family

## 2022-07-28 DIAGNOSIS — Z7901 Long term (current) use of anticoagulants: Secondary | ICD-10-CM

## 2022-07-31 DIAGNOSIS — I493 Ventricular premature depolarization: Secondary | ICD-10-CM | POA: Insufficient documentation

## 2022-08-10 ENCOUNTER — Other Ambulatory Visit: Payer: Self-pay | Admitting: *Deleted

## 2022-08-10 MED ORDER — FLUTICASONE PROPIONATE 50 MCG/ACT NA SUSP: NASAL | 1 refills | Status: DC

## 2022-08-12 ENCOUNTER — Ambulatory Visit (RURAL_HEALTH_CENTER): Payer: Self-pay | Admitting: Family

## 2022-08-27 ENCOUNTER — Ambulatory Visit (INDEPENDENT_AMBULATORY_CARE_PROVIDER_SITE_OTHER): Payer: Medicare PPO

## 2022-08-27 ENCOUNTER — Ambulatory Visit: Payer: Medicare PPO

## 2022-08-27 ENCOUNTER — Ambulatory Visit: Payer: Medicare PPO | Attending: Internal Medicine | Admitting: Internal Medicine

## 2022-08-27 ENCOUNTER — Encounter: Payer: Self-pay | Admitting: Internal Medicine

## 2022-08-27 VITALS — BP 136/67 | HR 60 | Resp 14 | Ht 67.5 in | Wt 197.0 lb

## 2022-08-27 DIAGNOSIS — M79641 Pain in right hand: Secondary | ICD-10-CM | POA: Diagnosis not present

## 2022-08-27 DIAGNOSIS — M130 Polyarthritis, unspecified: Secondary | ICD-10-CM

## 2022-08-27 DIAGNOSIS — M79672 Pain in left foot: Secondary | ICD-10-CM | POA: Diagnosis not present

## 2022-08-27 DIAGNOSIS — M79642 Pain in left hand: Secondary | ICD-10-CM

## 2022-08-27 DIAGNOSIS — M79671 Pain in right foot: Secondary | ICD-10-CM

## 2022-08-27 DIAGNOSIS — I480 Paroxysmal atrial fibrillation: Secondary | ICD-10-CM

## 2022-08-27 DIAGNOSIS — N1832 Chronic kidney disease, stage 3b: Secondary | ICD-10-CM

## 2022-08-27 DIAGNOSIS — J449 Chronic obstructive pulmonary disease, unspecified: Secondary | ICD-10-CM

## 2022-08-27 DIAGNOSIS — M1711 Unilateral primary osteoarthritis, right knee: Secondary | ICD-10-CM

## 2022-08-27 NOTE — Progress Notes (Signed)
Office Visit Note  Patient: Bob Newman             Date of Birth: 02/25/1946           MRN: [id number]             PCP: Stormy Fabian, NP Referring: Jessica Priest, MD Visit Date: 08/27/2022 Occupation: Various machine operation  Subjective:  New Patient (Initial Visit) (Patient states he has pain in his hands, elbows, shoulders, knees, and feet.)   History of Present Illness: Bob Newman is a 77 y.o. male here for evaluation of joint pain especially his hands. He has chronic medical problems including persistent asthma, CAD, DM, CKD stage 3, and GERD. 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 Does not recall any preceding medical event or changes. Initially saw sports medicine and had right knee injection with no appreciable benefit. Later chceked again and had left knee injection on 1/23 with very good improvement in his knee pain and stiffness as well as overall symptoms improved. Gradual benefit waning since about 6 weeks afterwards but still not as bad as onset.  Besides this he is taken over-the-counter Tylenol which did not help much.  He cannot take NSAID medications due to comorbidities.  He has morning stiffness on a daily basis uses hot water which helps alleviate this and can move better once he is up and later into the day.  He gets swelling in lower extremities and takes Lasix sometimes for edema and fluid retention.   Activities of Daily Living:  Patient reports morning stiffness for 20 minutes.   Patient Reports nocturnal pain.  Difficulty dressing/grooming: Denies Difficulty climbing stairs: Denies Difficulty getting out of chair: Denies Difficulty using hands for taps, buttons, cutlery, and/or writing: Reports  Review of Systems  Constitutional:  Positive for fatigue.  HENT:  Negative for mouth sores and mouth dryness.   Eyes:  Negative for dryness.  Respiratory:  Positive for shortness of  breath.   Cardiovascular:  Positive for palpitations. Negative for chest pain.  Gastrointestinal:  Positive for diarrhea. Negative for blood in stool and constipation.  Endocrine: Negative for increased urination.  Genitourinary:  Negative for involuntary urination.  Musculoskeletal:  Positive for joint pain, gait problem, joint pain, joint swelling and morning stiffness. Negative for myalgias, muscle weakness, muscle tenderness and myalgias.  Skin:  Positive for rash. Negative for color change, hair loss and sensitivity to sunlight.  Allergic/Immunologic: Negative for susceptible to infections.  Neurological:  Positive for dizziness. Negative for headaches.  Hematological:  Negative for swollen glands.  Psychiatric/Behavioral:  Positive for sleep disturbance. Negative for depressed mood. The patient is nervous/anxious.     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 04/14/2022   CKD (chronic kidney disease) stage 3, GFR 30-59 ml/min (HCC) 01/14/2022   Iron deficiency anemia 01/14/2022   B12 deficiency 10/14/2021   COPD (chronic obstructive pulmonary disease) (HCC) 10/14/2021   Gastroesophageal reflux disease 10/14/2021   Obesity (BMI 30.0-34.9) 10/14/2021   Vitamin D deficiency 10/14/2021   Asthma 05/27/2018   Carotid artery stenosis 02/24/2017   Coronary artery calcification seen on CT scan 02/24/2017   Hyperlipidemia 02/24/2017   Long term (current) use of anticoagulants 02/24/2017   PAF (paroxysmal atrial fibrillation) (  HCC) 07/08/2015   Mild persistent asthma 04/25/2015   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   Allergic rhinoconjunctivitis 04/25/2015   Diabetes mellitus due to underlying condition with unspecified complications (HCC) 03/04/2015    Dyslipidemia 03/04/2015   Essential hypertension 03/04/2015    Past Medical History:  Diagnosis Date   Allergic rhinoconjunctivitis    Asthma    Atrial fibrillation (HCC)    GERD (gastroesophageal reflux disease)    High blood pressure     Family History  Problem Relation Age of Onset   Allergic rhinitis Neg Hx    Asthma Neg Hx    Urticaria Neg Hx    Past Surgical History:  Procedure Laterality Date   CHOLECYSTECTOMY     THROMBOENDARTERECTOMY Right 09/22/2016   Boykin Reaper, MD at [facility]   TONSILLECTOMY     Social History   Social History Narrative   Not on file   Immunization History  Administered Date(s) Administered   Influenza Split 04/12/2018, 04/10/2021, 04/14/2022   Influenza, Quadrivalent, Recombinant, Inj, Pf 03/21/2019   Moderna Sars-Covid-2 Vaccination 07/14/2019, 08/11/2019   Pneumococcal Conjugate-13 03/09/2015   Pneumococcal Polysaccharide-23 05/13/2016   Tdap 11/10/2016   Zoster Recombinat (Shingrix) 04/13/2017     Objective: Vital Signs: BP 136/67 (BP Location: Right Arm, Patient Position: Sitting, Cuff Size: Normal)   Pulse 60   Resp 14   Ht 5' 7.5" (1.715 m)   Wt 197 lb (89.4 kg)   BMI 30.40 kg/m    Physical Exam HENT:     Mouth/Throat:     Mouth: Mucous membranes are moist.     Pharynx: Oropharynx is clear.  Eyes:     Conjunctiva/sclera: Conjunctivae normal.  Cardiovascular:     Rate and Rhythm: Normal rate and regular rhythm.  Pulmonary:     Effort: Pulmonary effort is normal.     Comments: Bibasilar inspiratory crackles Musculoskeletal:     Right lower leg: Edema present.     Left lower leg: Edema present.     Comments: 1+ pitting edema both feet and ankles  Lymphadenopathy:     Cervical: No cervical adenopathy.  Skin:    General: Skin is warm and dry.     Findings: No rash.  Neurological:     Mental Status: He is alert.  Psychiatric:        Mood and Affect: Mood normal.      Musculoskeletal Exam:   Shoulders full ROM some pain with internal and external rotation at horizontal position, no palpable swelling Elbows full ROM no tenderness or swelling Wrists full ROM no tenderness or swelling Stiffness in MCP and PIP joints of both hands with tenderness worst at PIPs but no palpable synovitis, grip is slightly limited 80-90% Knee flexion slightly reduced, joint line tenderness to pressure, no palpable effusion Right ankle limited inversion and eversion ROM with some pain, left normal Tenderness to pressure at MTPs and PIPs in both feet   Investigation: No additional findings.  Imaging: No results found.  Recent Labs: Lab Results  Component Value Date   WBC 7.2 05/24/2020   HGB 14.2 05/24/2020   PLT 215 05/24/2020    Speciality Comments: No specialty comments available.  Procedures:  No procedures performed Allergies: Latex, Levofloxacin, Spiriva handihaler [tiotropium bromide monohydrate], and Polyethylene glycol   Assessment / Plan:     Visit Diagnoses: Polyarthritis - Plan: XR Hand 2 View Right, XR Hand 2 View Left, XR Foot 2 Views Right, XR Foot 2 Views Left, Rheumatoid factor, Cyclic  citrul peptide antibody, IgG, Sedimentation rate, C-reactive protein, Uric acid  Increased joint pain in multiple areas though I cannot see definite associated synovitis.  X-ray of bilateral hands checked shows osteoarthritis in multiple areas but no particular evidence of erosive or chronic inflammatory disease.  Will check additional lab workup with RA antibodies and serum inflammatory markers and uric acid for evidence of specific underlying inflammation process.  Primary osteoarthritis of right knee  Knee joint line tenderness to palpation but no appreciable swelling.  Chronic obstructive pulmonary disease, unspecified COPD type (HCC)  Has known COPD history but current findings more suggestive for mild volume overload with pitting edema and pulmonary expiratory crackles  bilaterally.  PAF (paroxysmal atrial fibrillation) (HCC) Stage 3b chronic kidney disease (HCC)  On long-term anticoagulation with Eliquis also with renal insufficiency so is not a good candidate for NSAIDs.  Orders: Orders Placed This Encounter  Procedures   XR Hand 2 View Right   XR Hand 2 View Left   XR Foot 2 Views Right   XR Foot 2 Views Left   Rheumatoid factor   Cyclic citrul peptide antibody, IgG   Sedimentation rate   C-reactive protein   Uric acid   No orders of the defined types were placed in this encounter.    Follow-Up Instructions: Return in about 2 weeks (around 09/10/2022) for New pt ?swlling/OA f/u 2wks.   Fuller Plan, MD  Note - This record has been created using [employer].  Chart creation errors have been sought, but may not always  have been located. Such creation errors do not reflect on  the standard of medical care.

## 2022-08-29 LAB — C-REACTIVE PROTEIN: CRP: 7.7 mg/L (ref <8.0)

## 2022-08-29 LAB — RHEUMATOID FACTOR: Rheumatoid fact SerPl-aCnc: <14 IU/mL (ref <14)

## 2022-08-29 LAB — CYCLIC CITRUL PEPTIDE ANTIBODY, IGG: Cyclic Citrullin Peptide Ab: <16 UNITS

## 2022-08-29 LAB — SEDIMENTATION RATE: Sed Rate: 17 mm/h (ref 0–20)

## 2022-08-29 LAB — URIC ACID: Uric Acid, Serum: 5.5 mg/dL (ref 4.0–8.0)

## 2022-09-09 ENCOUNTER — Encounter: Payer: Self-pay | Admitting: Internal Medicine

## 2022-09-09 ENCOUNTER — Ambulatory Visit: Payer: Medicare PPO | Attending: Internal Medicine | Admitting: Internal Medicine

## 2022-09-09 VITALS — BP 133/73 | HR 64 | Resp 12 | Ht 67.5 in | Wt 199.0 lb

## 2022-09-09 DIAGNOSIS — N1832 Chronic kidney disease, stage 3b: Secondary | ICD-10-CM

## 2022-09-09 DIAGNOSIS — M130 Polyarthritis, unspecified: Secondary | ICD-10-CM | POA: Diagnosis not present

## 2022-09-09 DIAGNOSIS — M1711 Unilateral primary osteoarthritis, right knee: Secondary | ICD-10-CM

## 2022-09-09 NOTE — Patient Instructions (Signed)
For osteoarthritis several treatments may be beneficial:  - Topical antiinflammatory medicine such as diclofenac or Voltaren can be applied to  affected area as needed. Topical analgesics containing CBD, menthol, or lidocaine can be tried.  - Turmeric has some antiinflammatory effect similar to NSAIDs and may help, if taken as a supplement should not be taken above recommended doses.   - Compressive gloves or sleeve can be helpful to support the joint especially if hurting or swelling with certain activities.  - Physical therapy referral can discuss exercises or activity modification to improve symptoms or strength if needed.  - Local steroid injection is an option if symptoms become worse and not controlled by the above options. 

## 2022-09-09 NOTE — Progress Notes (Signed)
Office Visit Note  Patient: Bob Newman             Date of Birth: 1946/05/24           MRN: [id number]             PCP: Stormy Fabian, NP Referring: Stormy Fabian, NP Visit Date: 09/09/2022   Subjective:  Follow-up   History of Present Illness: Bob Newman is a 77 y.o. male here for follow up for polyarticular joint pains and reported swelling.  At our initial visit we checked x-rays of the hands and feet these showed osteoarthritis changes were otherwise unremarkable.  Laboratory tests for evidence of RA gout or systemic inflammation were also negative.  He still seeing episodic swelling in his hands had a recent episode provoked after using the lawnmower.  He has ongoing knee pain and is scheduled for right knee intra-articular steroid injection for this.  The previous diarrhea symptoms resolved after about 5 weeks in total duration.   Previous HPI 08/27/22 Bob Newman is a 77 y.o. male here for evaluation of joint pain especially his hands. He has chronic medical problems including persistent asthma, CAD, DM, CKD stage 3, and GERD. 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 Does not recall any preceding medical event or changes. Initially saw sports medicine and had right knee injection with no appreciable benefit. Later chceked again and had left knee injection on 1/23 with very good improvement in his knee pain and stiffness as well as overall symptoms improved. Gradual benefit waning since about 6 weeks afterwards but still not as bad as onset.  Besides this he is taken over-the-counter Tylenol which did not help much.  He cannot take NSAID medications due to comorbidities.  He has morning stiffness on a daily basis uses hot water which helps alleviate this and can move better once he is up and later into the day.  He gets swelling in lower extremities and takes Lasix sometimes for edema and fluid retention.     Review of Systems  Constitutional:  Negative for fatigue.  HENT:  Negative for mouth sores and mouth dryness.   Eyes:  Negative for dryness.  Respiratory:  Positive for shortness of breath.   Cardiovascular:  Positive for palpitations. Negative for chest pain.  Gastrointestinal:  Positive for diarrhea. Negative for blood in stool and constipation.  Endocrine: Negative for increased urination.  Genitourinary:  Negative for involuntary urination.  Musculoskeletal:  Positive for joint pain, gait problem, joint pain, joint swelling, myalgias, muscle weakness, morning stiffness and myalgias. Negative for muscle tenderness.  Skin:  Positive for rash and sensitivity to sunlight. Negative for color change and hair loss.  Allergic/Immunologic: Negative for susceptible to infections.  Neurological:  Positive for dizziness. Negative for headaches.  Hematological:  Negative for swollen glands.  Psychiatric/Behavioral:  Positive for sleep disturbance. Negative for depressed mood. The patient is nervous/anxious.     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 04/14/2022   CKD (chronic kidney disease) stage 3, GFR 30-59 ml/min (HCC) 01/14/2022   Iron deficiency anemia 01/14/2022   B12 deficiency 10/14/2021   COPD (chronic obstructive pulmonary disease) (HCC) 10/14/2021   Gastroesophageal reflux disease 10/14/2021   Obesity (BMI 30.0-34.9) 10/14/2021  Vitamin D deficiency 10/14/2021   Asthma 05/27/2018   Carotid artery stenosis 02/24/2017   Coronary artery calcification seen on CT scan 02/24/2017   Hyperlipidemia 02/24/2017   Long term (current) use of anticoagulants 02/24/2017   PAF (paroxysmal atrial fibrillation) (HCC) 07/08/2015   Mild persistent asthma 04/25/2015   LPRD  (laryngopharyngeal reflux disease) 04/25/2015   Allergic rhinoconjunctivitis 04/25/2015   Diabetes mellitus due to underlying condition with unspecified complications (HCC) 03/04/2015   Dyslipidemia 03/04/2015   Essential hypertension 03/04/2015    Past Medical History:  Diagnosis Date   Allergic rhinoconjunctivitis    Asthma    Atrial fibrillation (HCC)    GERD (gastroesophageal reflux disease)    High blood pressure     Family History  Problem Relation Age of Onset   Allergic rhinitis Neg Hx    Asthma Neg Hx    Urticaria Neg Hx    Past Surgical History:  Procedure Laterality Date   CHOLECYSTECTOMY     THROMBOENDARTERECTOMY Right 09/22/2016   Boykin Reaper, MD at [facility]   TONSILLECTOMY     Social History   Social History Narrative   Not on file   Immunization History  Administered Date(s) Administered   Influenza Split 04/12/2018, 04/10/2021, 04/14/2022   Influenza, Quadrivalent, Recombinant, Inj, Pf 03/21/2019   Moderna Sars-Covid-2 Vaccination 07/14/2019, 08/11/2019   Pneumococcal Conjugate-13 03/09/2015   Pneumococcal Polysaccharide-23 05/13/2016   Tdap 11/10/2016   Zoster Recombinat (Shingrix) 04/13/2017     Objective: Vital Signs: BP 133/73 (BP Location: Left Arm, Patient Position: Sitting, Cuff Size: Normal)   Pulse 64   Resp 12   Ht 5' 7.5" (1.715 m)   Wt 199 lb (90.3 kg)   BMI 30.71 kg/m    Physical Exam Eyes:     Conjunctiva/sclera: Conjunctivae normal.  Cardiovascular:     Rate and Rhythm: Normal rate and regular rhythm.  Pulmonary:     Effort: Pulmonary effort is normal.     Breath sounds: Normal breath sounds.  Lymphadenopathy:     Cervical: No cervical adenopathy.  Skin:    General: Skin is warm and dry.  Neurological:     Mental Status: He is alert.  Psychiatric:        Mood and Affect: Mood normal.      Musculoskeletal Exam:  Shoulders full ROM some pain with end ROM extension and abduction Elbows full ROM no  tenderness or swelling Wrists full ROM no tenderness or swelling Stiffness in MCP and PIP joints of both hands with tenderness worst at PIPs but no palpable synovitis, grip is limited to 80-90% Knee flexion slightly reduced, joint line tenderness to pressure, no palpable effusion Right ankle limited inversion and eversion ROM with some pain, left normal Tenderness to pressure at MTPs and PIPs in both feet  Limited ultrasound examination of MCP and PIP joints on both hands negative for visible synovitis or synovial hypertrophy  Investigation: No additional findings.  Imaging: No results found.  Recent Labs: Lab Results  Component Value Date   WBC 7.2 05/24/2020   HGB 14.2 05/24/2020   PLT 215 05/24/2020    Speciality Comments: No specialty comments available.  Procedures:  No procedures performed Allergies: Latex, Levofloxacin, Spiriva handihaler [tiotropium bromide monohydrate], and Polyethylene glycol   Assessment / Plan:     Visit Diagnoses: Polyarthritis Primary osteoarthritis of right knee  At this time I do not see any definite evidence of active inflammatory arthritis.  Definitely has generalized osteoarthritis discussed treatment strategies for this  including several supplement options since he cannot safely take systemic NSAIDs.  We can follow-up to monitor for symptom progression or reassess whether more clinical evidence of inflammatory arthritis becomes apparent.  No steroid injection today as he already has plans for the right knee.  Stage 3b chronic kidney disease (HCC)  Mildly retaining fluid today and limits medication selection to avoid nephrotoxic agents.  Orders: No orders of the defined types were placed in this encounter.  No orders of the defined types were placed in this encounter.    Follow-Up Instructions: Return in about 6 months (around 03/11/2023), or if symptoms worsen or fail to improve, for OA/?swelling supplements f/u 6mos.   Fuller Plan, MD  Note - This record has been created using [employer].  Chart creation errors have been sought, but may not always  have been located. Such creation errors do not reflect on  the standard of medical care.

## 2022-09-15 ENCOUNTER — Ambulatory Visit (RURAL_HEALTH_CENTER): Payer: Self-pay | Admitting: Family

## 2022-09-16 ENCOUNTER — Encounter: Payer: Medicare PPO | Admitting: Internal Medicine

## 2022-10-06 DIAGNOSIS — I2510 Atherosclerotic heart disease of native coronary artery without angina pectoris: Secondary | ICD-10-CM

## 2022-11-03 ENCOUNTER — Other Ambulatory Visit (RURAL_HEALTH_CENTER): Payer: Self-pay | Admitting: Family

## 2022-11-18 ENCOUNTER — Ambulatory Visit: Payer: Medicare PPO | Admitting: Allergy and Immunology

## 2022-11-18 ENCOUNTER — Encounter: Payer: Self-pay | Admitting: Allergy and Immunology

## 2022-11-18 VITALS — BP 156/64 | HR 46 | Resp 14

## 2022-11-18 DIAGNOSIS — J3089 Other allergic rhinitis: Secondary | ICD-10-CM | POA: Diagnosis not present

## 2022-11-18 DIAGNOSIS — K219 Gastro-esophageal reflux disease without esophagitis: Secondary | ICD-10-CM

## 2022-11-18 DIAGNOSIS — J4540 Moderate persistent asthma, uncomplicated: Secondary | ICD-10-CM | POA: Diagnosis not present

## 2022-11-18 NOTE — Patient Instructions (Addendum)
  1. Continue reflux therapy:   A.  Omeprazole 40 mg - 1 tablet in AM  B.  Famotidine 40 mg - 1 tablet in PM  2. Continue anti-inflammatory therapy for chest:    A. Fluticasone 110 - 2 inhalations 2 times per day w/ spacer   B. montelukast 10 mg daily  3. Continue anti-inflammatory therapy for nose:   A. Afrin - 1 spray in single nostril in evening. Alternate nostrils  B. flonase + astelin - 1 spray each in both nostrils in evening  D. flonase + astelin - 1 spray each in both nostrils in morning  4. If Needed:   A. Proventil HFA or Xopenex HFA if needed  B. Zyrtec one tablet one time per day   C. nasal saline multiple times a day   D. Mucinex DM 2 tablets twice a day   5. "Action Plan" for flare up:   A. Increase Flovent to 3 inhalations 3 times per day  B. Use Albuterol HFA if needed  6. Return in 6 months or earlier if problem  7.  Plan for fall flu vaccine  8.  Attempt to avoid injected or oral steroids as much as possible.  9. Follow up with primary doctor or cardiology about blood pressure and heart rate.

## 2022-11-18 NOTE — Progress Notes (Signed)
[place] - [place] - [place] - [name] - [place]   Follow-up Note  Referring Provider: Stormy Fabian, NP Primary Provider: Stormy Fabian, NP Date of Office Visit: 11/18/2022  Subjective:   Bob Newman (DOB: July 10, 1945) is a 77 y.o. male who [facility] in re-evaluation of the following:  HPI: Bob Newman returns to this clinic in reevaluation of asthma, allergic rhinitis, LPR, and history of hand arthritis.  I last saw him in this clinic 22 July 2022 at which point in time he had a viral respiratory tract infection.  Overall he has done pretty well with his respiratory tract and he has not required a systemic steroid or antibiotic for any type of airway issue.  His most recent event occurred about 2 weeks ago when he developed nasal congestion and some cough but fortunately that appears to be resolving at this point.  It should be noted that he has received at least 2 injections of systemic steroids into his knee.  Whenever he gets an injection of systemic steroids everything is better including his hand issues in addition to improvement in his knee function and pain.  His nose was doing pretty well but as mentioned above about 2 weeks ago he developed this problem with nasal congestion and this did appear to correlate with mowing the grass.  He has not been having any problems with reflux.  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  As noted above whenever he receives a systemic steroid injection for his knee his hand issue improves significantly.  He continues to follow-up with his nephrologist who told him that he had excellent kidney function but most recently he has visited with Dr. Manus Newman who told him that his kidney function has decreased and Dr. Manus Newman has asked him to change his  antihypertensive medications.  Allergies as of 11/18/2022       Reactions   Latex Shortness Of Breath   Levofloxacin Hives, Other (See Comments)   Hives  Was taking Levaquin, Miralax and Spiriva at same time.  MDs unsure of which caused reaction   Spiriva Handihaler [tiotropium Bromide Monohydrate] Hives   Polyethylene Glycol Hives        Medication List    albuterol 108 (90 Base) MCG/ACT inhaler Commonly known as: VENTOLIN HFA 2 inhalations every 4-6 hours as needed   amLODipine 5 MG tablet Commonly known as: NORVASC 5 mg daily.   azelastine 0.1 % nasal spray Commonly known as: ASTELIN Use one spray in each nostril twice daily as directed.   candesartan 16 MG tablet Commonly known as: ATACAND Take 16 mg by mouth daily.   Crestor 20 MG tablet Generic drug: rosuvastatin Take 20 mg by mouth daily.   cyanocobalamin 500 MCG tablet Commonly known as: VITAMIN B12 Take by mouth.   dicyclomine 10 MG capsule Commonly known as: BENTYL Take 10 mg by mouth every 6 (six) hours as needed.   dronedarone 400 MG tablet Commonly known as: MULTAQ Take 400 mg by mouth 2 (two) times daily with a meal.   Eliquis 5 MG Tabs tablet Generic drug: apixaban   esomeprazole 40 MG capsule Commonly known as: NEXIUM Take 40 mg by mouth every morning.   famotidine 40 MG tablet Commonly known as: PEPCID Take 1 tablet (40 mg total) by mouth at bedtime.  ferrous sulfate 325 (65 FE) MG tablet Take by mouth.   fluticasone 110 MCG/ACT inhaler Commonly known as: Flovent HFA Inhale 2 puffs into the lungs 2 (two) times daily. With spacer.   fluticasone 50 MCG/ACT nasal spray Commonly known as: FLONASE One spray in both nostrils in the morning and the evening   magnesium oxide 400 MG tablet Commonly known as: MAG-OX Take by mouth.   metoprolol tartrate 50 MG tablet Commonly known as: LOPRESSOR Take 50 mg by mouth daily.   montelukast 10 MG tablet Commonly known as:  SINGULAIR TAKE ONE TABLET ONCE DAILY   nitroGLYCERIN 0.4 MG SL tablet Commonly known as: NITROSTAT   omeprazole 40 MG capsule Commonly known as: PRILOSEC TAKE ONE CAPSULE (40 MG TOTAL) BY MOUTH EVERY MORNING AS DIRECTED.   Qvar RediHaler 80 MCG/ACT inhaler Generic drug: beclomethasone Inhale two doses twice daily to prevent cough or wheeze.  Rinse, gargle, and spit after use.    Past Medical History:  Diagnosis Date   Allergic rhinoconjunctivitis    Asthma    Atrial fibrillation (HCC)    GERD (gastroesophageal reflux disease)    High blood pressure     Past Surgical History:  Procedure Laterality Date   CHOLECYSTECTOMY     THROMBOENDARTERECTOMY Right 09/22/2016   Bob Reaper, MD at [facility]   TONSILLECTOMY      Review of systems negative except as noted in HPI / PMHx or noted below:  Review of Systems  Constitutional: Negative.   HENT: Negative.    Eyes: Negative.   Respiratory: Negative.    Cardiovascular: Negative.   Gastrointestinal: Negative.   Genitourinary: Negative.   Musculoskeletal: Negative.   Skin: Negative.   Neurological: Negative.   Endo/Heme/Allergies: Negative.   Psychiatric/Behavioral: Negative.       Objective:   Vitals:   11/18/22 1109 11/18/22 1132  BP: (!) 160/62 (!) 156/64  Pulse: (!) 48 (!) 46  Resp: 14   SpO2: 98%           Physical Exam Constitutional:      Appearance: He is not diaphoretic.  HENT:     Head: Normocephalic.     Right Ear: Tympanic membrane, ear canal and external ear normal.     Left Ear: Tympanic membrane, ear canal and external ear normal.     Nose: Nose normal. No mucosal edema or rhinorrhea.     Mouth/Throat:     Pharynx: Uvula midline. No oropharyngeal exudate.  Eyes:     Conjunctiva/sclera: Conjunctivae normal.  Neck:     Thyroid: No thyromegaly.     Trachea: Trachea normal. No tracheal tenderness or tracheal deviation.  Cardiovascular:     Rate and Rhythm: Normal rate and regular  rhythm.     Heart sounds: Normal heart sounds, S1 normal and S2 normal. No murmur heard. Pulmonary:     Effort: No respiratory distress.     Breath sounds: Normal breath sounds. No stridor. No wheezing or rales.  Lymphadenopathy:     Head:     Right side of head: No tonsillar adenopathy.     Left side of head: No tonsillar adenopathy.     Cervical: No cervical adenopathy.  Skin:    Findings: No erythema or rash.     Nails: There is no clubbing.  Neurological:     Mental Status: He is alert.     Diagnostics: Spirometry was performed and demonstrated an FEV1 of 2.44 at 93 % of predicted.  Assessment and Plan:  1. Asthma, moderate persistent, well-controlled   2. Other allergic rhinitis   3. LPRD (laryngopharyngeal reflux disease)    1. Continue reflux therapy:   A.  Omeprazole 40 mg - 1 tablet in AM  B.  Famotidine 40 mg - 1 tablet in PM  2. Continue anti-inflammatory therapy for chest:    A. Fluticasone 110 - 2 inhalations 2 times per day w/ spacer   B. montelukast 10 mg daily  3. Continue anti-inflammatory therapy for nose:   A. Afrin - 1 spray in single nostril in evening. Alternate nostrils  B. flonase + astelin - 1 spray each in both nostrils in evening  D. flonase + astelin - 1 spray each in both nostrils in morning  4. If Needed:   A. Proventil HFA or Xopenex HFA if needed  B. Zyrtec one tablet one time per day   C. nasal saline multiple times a day   D. Mucinex DM 2 tablets twice a day   5. "Action Plan" for flare up:   A. Increase Flovent to 3 inhalations 3 times per day  B. Use Albuterol HFA if needed  6. Return in 6 months or earlier if problem  7.  Plan for fall flu vaccine  8.  Attempt to avoid injected or oral steroids as much as possible.  9. Follow up with primary doctor or cardiology about blood pressure and heart rate.   Makhi appears to be stable regarding his respiratory tract disease on his current plan and he will remain on a  collection of anti-inflammatory agents for his airway and therapy directed against reflux as noted above.  He is receiving a fair amount of systemic steroid administration for musculoskeletal issues and occasionally for his respiratory tract and I had a talk with him today about the need to minimize exposure to systemic steroids as much as possible.  And, he appears to have a problem with maintaining his blood pressure and his heart rate in an acceptable range and he can follow-up with his primary care doctor and cardiologist about that issue.  If he does well I will see him back in this clinic in 6 months.  Laurette Schimke, MD Allergy / Immunology [facility]

## 2022-11-19 ENCOUNTER — Encounter: Payer: Self-pay | Admitting: Allergy and Immunology

## 2023-03-03 NOTE — Progress Notes (Deleted)
Office Visit Note  Patient: Bob Newman             Date of Birth: 1945/07/28           MRN: [id number]             PCP: Stormy Fabian, NP Referring: Stormy Fabian, NP Visit Date: 03/15/2023   Subjective:  No chief complaint on file.   History of Present Illness: Thao Daffern is a 77 y.o. male here for follow up for polyarticular joint pains and reported swelling.    Previous HPI 09/09/2022 Adoniah Blumenschein is a 77 y.o. male here for follow up for polyarticular joint pains and reported swelling.  At our initial visit we checked x-rays of the hands and feet these showed osteoarthritis changes were otherwise unremarkable.  Laboratory tests for evidence of RA gout or systemic inflammation were also negative.  He still seeing episodic swelling in his hands had a recent episode provoked after using the lawnmower.  He has ongoing knee pain and is scheduled for right knee intra-articular steroid injection for this.  The previous diarrhea symptoms resolved after about 5 weeks in total duration.     Previous HPI 08/27/22 Camille Shillito is a 77 y.o. male here for evaluation of joint pain especially his hands. He has chronic medical problems including persistent asthma, CAD, DM, CKD stage 3, and GERD. 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 Does not recall any preceding medical event or changes. Initially saw sports medicine and had right knee injection with no appreciable benefit. Later chceked again and had left knee injection on 1/23 with very good improvement in his knee pain and stiffness as well as overall symptoms improved. Gradual benefit waning since about 6 weeks afterwards but still not as bad as onset.  Besides this he is taken over-the-counter Tylenol which did not help much.  He cannot take NSAID medications due to comorbidities.  He has morning stiffness on a daily basis uses hot water which helps alleviate  this and can move better once he is up and later into the day.  He gets swelling in lower extremities and takes Lasix sometimes for edema and fluid retention.   No Rheumatology ROS completed.   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 04/14/2022   CKD (chronic kidney disease) stage 3, GFR 30-59 ml/min (HCC) 01/14/2022   Iron deficiency anemia 01/14/2022   B12 deficiency 10/14/2021   COPD (chronic obstructive pulmonary disease) (HCC) 10/14/2021   Gastroesophageal reflux disease 10/14/2021   Obesity (BMI 30.0-34.9) 10/14/2021   Vitamin D deficiency 10/14/2021   Asthma 05/27/2018   Carotid artery stenosis 02/24/2017   Coronary artery calcification seen on CT scan 02/24/2017   Hyperlipidemia 02/24/2017   Long term (current) use of anticoagulants 02/24/2017   PAF (paroxysmal atrial fibrillation) (HCC) 07/08/2015   Mild persistent asthma 04/25/2015   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   Allergic rhinoconjunctivitis 04/25/2015   Diabetes mellitus due to underlying condition with unspecified complications (HCC) 03/04/2015   Dyslipidemia 03/04/2015   Essential hypertension 03/04/2015    Past Medical History:  Diagnosis Date   Allergic rhinoconjunctivitis    Asthma    Atrial fibrillation (HCC)    GERD (gastroesophageal reflux disease)  High blood pressure     Family History  Problem Relation Age of Onset   Allergic rhinitis Neg Hx    Asthma Neg Hx    Urticaria Neg Hx    Past Surgical History:  Procedure Laterality Date   CHOLECYSTECTOMY     THROMBOENDARTERECTOMY Right 09/22/2016   Boykin Reaper, MD at [facility]   TONSILLECTOMY     Social History   Social History Narrative   Not on file   Immunization History  Administered  Date(s) Administered   Influenza Split 04/12/2018, 04/10/2021, 04/14/2022   Influenza, Quadrivalent, Recombinant, Inj, Pf 03/21/2019   Moderna Sars-Covid-2 Vaccination 07/14/2019, 08/11/2019   Pneumococcal Conjugate-13 03/09/2015   Pneumococcal Polysaccharide-23 05/13/2016   Tdap 11/10/2016   Zoster Recombinant(Shingrix) 04/13/2017     Objective: Vital Signs: There were no vitals taken for this visit.   Physical Exam   Musculoskeletal Exam: ***  CDAI Exam: CDAI Score: -- Patient Global: --; Provider Global: -- Swollen: --; Tender: -- Joint Exam 03/15/2023   No joint exam has been documented for this visit   There is currently no information documented on the homunculus. Go to the Rheumatology activity and complete the homunculus joint exam.  Investigation: No additional findings.  Imaging: No results found.  Recent Labs: Lab Results  Component Value Date   WBC 7.2 05/24/2020   HGB 14.2 05/24/2020   PLT 215 05/24/2020    Speciality Comments: No specialty comments available.  Procedures:  No procedures performed Allergies: Latex, Levofloxacin, Spiriva handihaler [tiotropium bromide monohydrate], and Polyethylene glycol   Assessment / Plan:     Visit Diagnoses: No diagnosis found.  ***  Orders: No orders of the defined types were placed in this encounter.  No orders of the defined types were placed in this encounter.    Follow-Up Instructions: No follow-ups on file.   Metta Clines, RT  Note - This record has been created using [employer].  Chart creation errors have been sought, but may not always  have been located. Such creation errors do not reflect on  the standard of medical care.

## 2023-03-15 ENCOUNTER — Ambulatory Visit: Payer: Medicare PPO | Admitting: Internal Medicine

## 2023-03-15 DIAGNOSIS — N1832 Chronic kidney disease, stage 3b: Secondary | ICD-10-CM

## 2023-03-15 DIAGNOSIS — M1711 Unilateral primary osteoarthritis, right knee: Secondary | ICD-10-CM

## 2023-03-15 DIAGNOSIS — M130 Polyarthritis, unspecified: Secondary | ICD-10-CM

## 2023-03-16 NOTE — Progress Notes (Signed)
Office Visit Note  Patient: Bob Newman             Date of Birth: [birth year]           MRN: [id number]             PCP: Paulina Fusi, MD Referring: Stormy Fabian, NP Visit Date: 03/26/2023   Subjective:  Follow-up (Patient has questions about gel injections. )   History of Present Illness: Bob Newman is a 77 y.o. male here for follow up for polyarticular joint pains and reported swelling.  Since her last visit he had another steroid injection in the right knee and he did not notice any significant improvement after this shot.  He has now had a total of 2 steroid injections in both knees during the past 2 years and had a few weeks of improvement after the first each time and no significant proven after the second.  He is interested in discussing viscosupplementation injections for his severe osteoarthritis.  Also continues having hand pain and stiffness most bothersome after he uses his 0 turn mower for a few hours.  A lot of pain and stiffness around the MCP joints but he sees visible swelling at the backs of each wrist.  Previous HPI 09/09/2022 Bob Newman is a 77 y.o. male here for follow up for polyarticular joint pains and reported swelling.  At our initial visit we checked x-rays of the hands and feet these showed osteoarthritis changes were otherwise unremarkable.  Laboratory tests for evidence of RA gout or systemic inflammation were also negative.  He still seeing episodic swelling in his hands had a recent episode provoked after using the lawnmower.  He has ongoing knee pain and is scheduled for right knee intra-articular steroid injection for this.  The previous diarrhea symptoms resolved after about 5 weeks in total duration.   Previous HPI 08/27/22 Bob Newman is a 77 y.o. male here for evaluation of joint pain especially his hands. He has chronic medical problems including persistent asthma, CAD, DM, CKD stage 3, and GERD. He has longstanding  osteoarthritis in several areas but current problem really started around October 2023. Does not recall any preceding medical event or changes. Initially saw sports medicine and had right knee injection with no appreciable benefit. Later chceked again and had left knee injection on 1/23 with very good improvement in his knee pain and stiffness as well as overall symptoms improved. Gradual benefit waning since about 6 weeks afterwards but still not as bad as onset.  Besides this he is taken over-the-counter Tylenol which did not help much.  He cannot take NSAID medications due to comorbidities.  He has morning stiffness on a daily basis uses hot water which helps alleviate this and can move better once he is up and later into the day.  He gets swelling in lower extremities and takes Lasix sometimes for edema and fluid retention.   Review of Systems  Constitutional:  Positive for fatigue.  HENT:  Negative for mouth sores and mouth dryness.   Eyes:  Negative for dryness.  Respiratory:  Positive for shortness of breath.   Cardiovascular:  Negative for chest pain and palpitations.  Gastrointestinal:  Negative for blood in stool, constipation and diarrhea.  Endocrine: Negative for increased urination.  Genitourinary:  Negative for involuntary urination.  Musculoskeletal:  Positive for joint pain, gait problem, joint pain, joint swelling, myalgias, muscle weakness, morning stiffness and myalgias. Negative for muscle tenderness.  Skin:  Positive for  color change, rash and sensitivity to sunlight. Negative for hair loss.  Allergic/Immunologic: Negative for susceptible to infections.  Neurological:  Positive for dizziness. Negative for headaches.  Hematological:  Negative for swollen glands.  Psychiatric/Behavioral:  Positive for sleep disturbance. Negative for depressed mood. The patient is not nervous/anxious.     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 04/14/2022   CKD (chronic kidney disease) stage 3, GFR 30-59 ml/min (HCC) 01/14/2022   Iron deficiency anemia 01/14/2022   B12 deficiency 10/14/2021   COPD (chronic obstructive pulmonary disease) (HCC) 10/14/2021   Gastroesophageal reflux disease 10/14/2021   Obesity (BMI 30.0-34.9) 10/14/2021   Vitamin D deficiency 10/14/2021   Asthma 05/27/2018   Carotid artery stenosis 02/24/2017   Coronary artery calcification seen on CT scan 02/24/2017   Hyperlipidemia 02/24/2017   Long term (current) use of anticoagulants 02/24/2017   PAF (paroxysmal atrial fibrillation) (HCC) 07/08/2015   Mild persistent asthma 04/25/2015   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   Allergic rhinoconjunctivitis 04/25/2015   Diabetes mellitus due to underlying condition with unspecified complications (HCC) 03/04/2015   Dyslipidemia 03/04/2015   Essential hypertension 03/04/2015    Past Medical History:  Diagnosis Date   Allergic rhinoconjunctivitis    Asthma    Atrial fibrillation (HCC)    GERD (gastroesophageal reflux disease)    High blood pressure     Family History  Problem Relation Age of Onset   Allergic rhinitis Neg Hx    Asthma Neg Hx    Urticaria Neg Hx    Past Surgical History:  Procedure Laterality Date   CHOLECYSTECTOMY     THROMBOENDARTERECTOMY Right 09/22/2016   Boykin Reaper, MD at [facility]   TONSILLECTOMY     Social History   Social History Narrative   Not on file   Immunization History  Administered Date(s) Administered   Influenza Split 04/12/2018, 04/10/2021, 04/14/2022   Influenza, Quadrivalent, Recombinant, Inj, Pf 03/21/2019   Moderna Sars-Covid-2 Vaccination 07/14/2019, 08/11/2019   Pneumococcal Conjugate-13 03/09/2015    Pneumococcal Polysaccharide-23 05/13/2016   Tdap 11/10/2016   Zoster Recombinant(Shingrix) 04/13/2017     Objective: Vital Signs: BP 118/66 (BP Location: Left Arm, Patient Position: Sitting, Cuff Size: Normal)   Pulse 61   Resp 14   Ht 5\' 8"  (1.727 m)   Wt 204 lb (92.5 kg)   BMI 31.02 kg/m    Physical Exam Cardiovascular:     Rate and Rhythm: Normal rate and regular rhythm.  Pulmonary:     Effort: Pulmonary effort is normal.     Breath sounds: Normal breath sounds.  Musculoskeletal:     Right lower leg: No edema.     Left lower leg: No edema.  Skin:    General: Skin is warm and dry.     Findings: No rash.  Neurological:     Mental Status: He is alert.      Musculoskeletal Exam:  Shoulders full ROM some pain with end ROM extension and abduction Elbows full ROM no tenderness or swelling Wrists full ROM, mild tenderness to pressure there is swelling on the dorsal side of the wrist and proximal hand worse on left side Stiffness in MCP and PIP joints of both hands, grip is limited to 80-90%, chronic appearing  joint soft tissue thickening with no palpable swelling Knee flexion ROM slightly reduced, joint line tenderness to pressure, no palpable effusions  Limited musculoskeletal ultrasound exam of the hands and wrist is negative for any synovitis or hyperemia of finger joints but there is visible tenosynovitis swelling on the dorsum of the wrist  Investigation: No additional findings.  Imaging: No results found.  Recent Labs: Lab Results  Component Value Date   WBC 7.2 05/24/2020   HGB 14.2 05/24/2020   PLT 215 05/24/2020    Speciality Comments: No specialty comments available.  Procedures:  No procedures performed Allergies: Latex, Levofloxacin, Spiriva handihaler [tiotropium bromide monohydrate], and Polyethylene glycol   Assessment / Plan:     Visit Diagnoses: Polyarthritis - Plan: C-reactive protein, Sedimentation rate, Mutated Citrullinated Vimentin (MCV)  Antibody  Exam today is slightly more concerning for inflammatory arthritis as there is objective swelling affecting the wrists.  Could still be overuse related with his existing osteoarthritis and doing more strenuous physical activities as the main provocation.  Will recheck serum inflammatory markers and for MCV antibody.  Low threshold for trial of oral DMARD if positive findings otherwise might benefit to see occupational therapy.  Stage 3b chronic kidney disease (HCC)  Not a good candidate for systemic NSAIDs due to renal disease.  If trying addition of antirheumatic DMARD will also be limiting consideration to probably hydroxychloroquine leflunomide or biologic.  Bilateral primary osteoarthritis of knee  The symptoms appear most consistent with his advanced degenerative arthritis.  He has tried several rounds of intra-articular steroid injections and cannot tolerate chronic oral NSAIDs.  He has not completed any formal physical therapy for this recently although I doubt he would see great improvement given extent of joint disease.  He is interested in viscosupplementation we will look into PA for this.  Orders: Orders Placed This Encounter  Procedures   C-reactive protein   Sedimentation rate   Mutated Citrullinated Vimentin (MCV) Antibody   No orders of the defined types were placed in this encounter.    Follow-Up Instructions: No follow-ups on file.   Fuller Plan, MD  Note - This record has been created using [employer].  Chart creation errors have been sought, but may not always  have been located. Such creation errors do not reflect on  the standard of medical care.

## 2023-03-26 ENCOUNTER — Ambulatory Visit: Payer: Medicare PPO | Attending: Internal Medicine | Admitting: Internal Medicine

## 2023-03-26 ENCOUNTER — Encounter: Payer: Self-pay | Admitting: Internal Medicine

## 2023-03-26 VITALS — BP 118/66 | HR 61 | Resp 14 | Ht 68.0 in | Wt 204.0 lb

## 2023-03-26 DIAGNOSIS — M130 Polyarthritis, unspecified: Secondary | ICD-10-CM | POA: Diagnosis not present

## 2023-03-26 DIAGNOSIS — N1832 Chronic kidney disease, stage 3b: Secondary | ICD-10-CM

## 2023-03-26 DIAGNOSIS — M1711 Unilateral primary osteoarthritis, right knee: Secondary | ICD-10-CM

## 2023-03-26 DIAGNOSIS — M170 Bilateral primary osteoarthritis of knee: Secondary | ICD-10-CM | POA: Insufficient documentation

## 2023-04-02 LAB — C-REACTIVE PROTEIN: CRP: 20 mg/L — ABNORMAL HIGH (ref <8.0)

## 2023-04-02 LAB — MUTATED CITRULLINATED VIMENTIN (MCV) ANTIBODY: MUTATED CITRULLINATED VIMENTIN (MCV) AB: <20 U/mL (ref <20)

## 2023-04-02 LAB — SEDIMENTATION RATE: Sed Rate: 33 mm/h — ABNORMAL HIGH (ref 0–20)

## 2023-05-04 ENCOUNTER — Telehealth: Payer: Self-pay | Admitting: Allergy and Immunology

## 2023-05-04 ENCOUNTER — Telehealth: Payer: Self-pay | Admitting: *Deleted

## 2023-05-04 ENCOUNTER — Other Ambulatory Visit: Payer: Self-pay

## 2023-05-04 MED ORDER — FAMOTIDINE 40 MG PO TABS: 40.0000 mg | ORAL_TABLET | Freq: Every day | ORAL | 1 refills | Status: DC

## 2023-05-04 MED ORDER — AZELASTINE HCL 0.1 % NA SOLN: NASAL | 1 refills | Status: DC

## 2023-05-04 MED ORDER — MONTELUKAST SODIUM 10 MG PO TABS: ORAL_TABLET | ORAL | 1 refills | Status: DC

## 2023-05-04 MED ORDER — OMEPRAZOLE 40 MG PO CPDR: DELAYED_RELEASE_CAPSULE | ORAL | 1 refills | Status: DC

## 2023-05-04 MED ORDER — ALBUTEROL SULFATE HFA 108 (90 BASE) MCG/ACT IN AERS: INHALATION_SPRAY | RESPIRATORY_TRACT | 1 refills | Status: DC

## 2023-05-04 MED ORDER — FLUTICASONE PROPIONATE 50 MCG/ACT NA SUSP: NASAL | 1 refills | Status: DC

## 2023-05-04 MED ORDER — QVAR REDIHALER 80 MCG/ACT IN AERB: INHALATION_SPRAY | RESPIRATORY_TRACT | 1 refills | Status: DC

## 2023-05-04 NOTE — Telephone Encounter (Signed)
Prescriptions sent and patient informed.

## 2023-05-04 NOTE — Telephone Encounter (Signed)
Patient is aware that we will be applying for visco injections after 06/09/23.

## 2023-05-04 NOTE — Telephone Encounter (Signed)
Please advice if change is appropriate

## 2023-05-04 NOTE — Telephone Encounter (Signed)
Patient is requesting a refill on all his medications to be sent to [facility]. He is also wanting to know if he is able to switch back to Qvar. He does not like Flovent because of the big chamber.

## 2023-05-04 NOTE — Telephone Encounter (Signed)
Per office note on 03/26/2023: He is interested in viscosupplementation we will look into PA for this.   Patient's wife left message to inquire if we have received insurance approval. PLease reach out to patient and his wife to advise.

## 2023-05-20 ENCOUNTER — Encounter: Payer: Self-pay | Admitting: Allergy and Immunology

## 2023-05-20 ENCOUNTER — Ambulatory Visit: Payer: Medicare PPO | Admitting: Allergy and Immunology

## 2023-05-20 VITALS — BP 136/82 | HR 76 | Resp 16

## 2023-05-20 DIAGNOSIS — D509 Iron deficiency anemia, unspecified: Secondary | ICD-10-CM

## 2023-05-20 DIAGNOSIS — J4550 Severe persistent asthma, uncomplicated: Secondary | ICD-10-CM

## 2023-05-20 DIAGNOSIS — J988 Other specified respiratory disorders: Secondary | ICD-10-CM

## 2023-05-20 DIAGNOSIS — J3089 Other allergic rhinitis: Secondary | ICD-10-CM

## 2023-05-20 DIAGNOSIS — K219 Gastro-esophageal reflux disease without esophagitis: Secondary | ICD-10-CM

## 2023-05-20 MED ORDER — OMEPRAZOLE 40 MG PO CPDR: DELAYED_RELEASE_CAPSULE | ORAL | 1 refills | Status: AC

## 2023-05-20 MED ORDER — FLUTICASONE PROPIONATE 50 MCG/ACT NA SUSP: NASAL | 1 refills | Status: AC

## 2023-05-20 MED ORDER — AZELASTINE HCL 0.1 % NA SOLN: NASAL | 1 refills | Status: AC

## 2023-05-20 MED ORDER — FLUTICASONE PROPIONATE HFA 110 MCG/ACT IN AERO: 2.0000 | INHALATION_SPRAY | Freq: Two times a day (BID) | RESPIRATORY_TRACT | 1 refills | Status: DC

## 2023-05-20 MED ORDER — MONTELUKAST SODIUM 10 MG PO TABS: ORAL_TABLET | ORAL | 1 refills | Status: AC

## 2023-05-20 MED ORDER — ALBUTEROL SULFATE HFA 108 (90 BASE) MCG/ACT IN AERS: INHALATION_SPRAY | RESPIRATORY_TRACT | 1 refills | Status: AC

## 2023-05-20 MED ORDER — PREDNISONE 10 MG PO TABS: 10.0000 mg | ORAL_TABLET | Freq: Every day | ORAL | 0 refills | Status: AC

## 2023-05-20 MED ORDER — FAMOTIDINE 40 MG PO TABS: 40.0000 mg | ORAL_TABLET | Freq: Every day | ORAL | 1 refills | Status: AC

## 2023-05-20 MED ORDER — AZITHROMYCIN 500 MG PO TABS: 500.0000 mg | ORAL_TABLET | Freq: Every day | ORAL | 0 refills | Status: AC

## 2023-05-20 NOTE — Progress Notes (Signed)
[place] [facility] [place] [facility] [name] [facility] [name] [facility] [place]   Follow-up Note  Referring Provider: Stormy Fabian, NP Primary Provider: Paulina Fusi, MD Date of Office Visit: 05/20/2023  Subjective:   Bob Newman (DOB: 01/27/1946) is a 77 y.o. male who [facility] in re-evaluation of the following:  HPI: Sha returns to this clinic in evaluation of asthma, allergic rhinitis, LPR.  I last saw him in his clinic 18 November 2022.  He states that he has a lot going on.  For the past week he has had nasal congestion and nose blowing and green nasal discharge and green postnasal drip and cough.  He feels as though his lungs are "on fire.  He is having spells of cough and he cannot smell very well and has not had any type of viral swabs completed.  Prior to this point in time he thought he was doing pretty well with his respiratory tract while consistently using medications directed against respiratory tract inflammation.  He thinks his reflux is doing pretty well at this point in time.  He apparently contracted shingles on his anterior chest and is now on gabapentin but most of his cutaneous lesions appear to have abated and he has very little pain at this point.Mar[facility] Kitchen  He is having significant problems with his arthritis and he is not really getting the result that he would like with his rheumatologist.  He shows me some blood work today and his hemoglobin is down to 10.1.  Apparently he has had check for GI bleeding and all of his checks have been negative.  Allergies as of 05/20/2023       Reactions   Latex Shortness Of Breath   Levofloxacin Hives, Other (See Comments)   Hives  Was taking Levaquin, Miralax and Spiriva at same time.  MDs unsure of which caused reaction   Spiriva Handihaler [tiotropium Bromide Monohydrate] Hives   Polyethylene Glycol Hives        Medication List    albuterol 108 (90 Base) MCG/ACT inhaler Commonly  known as: VENTOLIN HFA 2 inhalations every 4-6 hours as needed   amLODipine 5 MG tablet Commonly known as: NORVASC 5 mg daily.   azelastine 0.1 % nasal spray Commonly known as: ASTELIN Use one spray in each nostril twice daily as directed.   azithromycin 500 MG tablet Commonly known as: ZITHROMAX Take 1 tablet (500 mg total) by mouth daily for 3 days. Started by: Simon Aaberg J Jackalyn Haith   candesartan 16 MG tablet Commonly known as: ATACAND Take 16 mg by mouth daily.   Crestor 20 MG tablet Generic drug: rosuvastatin Take 20 mg by mouth daily.   cyanocobalamin 500 MCG tablet Commonly known as: VITAMIN B12 Take by mouth.   dicyclomine 10 MG capsule Commonly known as: BENTYL Take 10 mg by mouth every 6 (six) hours as needed.   dronedarone 400 MG tablet Commonly known as: MULTAQ Take 400 mg by mouth 2 (two) times daily with a meal.   Eliquis 5 MG Tabs tablet Generic drug: apixaban   famotidine 40 MG tablet Commonly known as: PEPCID Take 1 tablet (40 mg total) by mouth at bedtime.   ferrous sulfate 325 (65 FE) MG tablet Take by mouth.   fluticasone 110 MCG/ACT inhaler Commonly known as: Flovent HFA Inhale 2 puffs into the lungs 2 (two) times daily. With spacer.   fluticasone 50 MCG/ACT nasal spray Commonly known as: FLONASE One spray in both  nostrils in the morning and the evening   furosemide 40 MG tablet Commonly known as: LASIX Take by mouth.   magnesium oxide 400 MG tablet Commonly known as: MAG-OX Take by mouth.   metoprolol succinate 50 MG 24 hr tablet Commonly known as: TOPROL-XL Take by mouth.   metoprolol tartrate 50 MG tablet Commonly known as: LOPRESSOR Take 50 mg by mouth daily.   montelukast 10 MG tablet Commonly known as: SINGULAIR TAKE ONE TABLET ONCE DAILY   nitroGLYCERIN 0.4 MG SL tablet Commonly known as: NITROSTAT   omeprazole 40 MG capsule Commonly known as: PRILOSEC TAKE ONE CAPSULE (40 MG TOTAL) BY MOUTH EVERY MORNING AS DIRECTED.    predniSONE 10 MG tablet Commonly known as: DELTASONE Take 1 tablet (10 mg total) by mouth daily with breakfast for 3 days. Started by: Jessica Priest    Past Medical History:  Diagnosis Date   Allergic rhinoconjunctivitis    Asthma    Atrial fibrillation (HCC)    GERD (gastroesophageal reflux disease)    High blood pressure     Past Surgical History:  Procedure Laterality Date   CHOLECYSTECTOMY     THROMBOENDARTERECTOMY Right 09/22/2016   Boykin Reaper, MD at [facility]   TONSILLECTOMY      Review of systems negative except as noted in HPI / PMHx or noted below:  Review of Systems  Constitutional: Negative.   HENT: Negative.    Eyes: Negative.   Respiratory: Negative.    Cardiovascular: Negative.   Gastrointestinal: Negative.   Genitourinary: Negative.   Musculoskeletal: Negative.   Skin: Negative.   Neurological: Negative.   Endo/Heme/Allergies: Negative.   Psychiatric/Behavioral: Negative.       Objective:   Vitals:   05/20/23 1349  BP: 136/82  Pulse: 76  Resp: 16  SpO2: 97%          Physical Exam Constitutional:      Appearance: He is not diaphoretic.  HENT:     Head: Normocephalic.     Right Ear: Tympanic membrane, ear canal and external ear normal.     Left Ear: Tympanic membrane, ear canal and external ear normal.     Nose: Mucosal edema present. No rhinorrhea.     Mouth/Throat:     Pharynx: Uvula midline. No oropharyngeal exudate.  Eyes:     Conjunctiva/sclera: Conjunctivae normal.  Neck:     Thyroid: No thyromegaly.     Trachea: Trachea normal. No tracheal tenderness or tracheal deviation.  Cardiovascular:     Rate and Rhythm: Normal rate and regular rhythm.     Heart sounds: Normal heart sounds, S1 normal and S2 normal. No murmur heard. Pulmonary:     Effort: No respiratory distress.     Breath sounds: Normal breath sounds. No stridor. No wheezing or rales.  Lymphadenopathy:     Head:     Right side of head: No tonsillar  adenopathy.     Left side of head: No tonsillar adenopathy.     Cervical: No cervical adenopathy.  Skin:    Findings: No erythema or rash.     Nails: There is no clubbing.  Neurological:     Mental Status: He is alert.     Diagnostics: Spirometry was performed and demonstrated an FEV1 of 2.31 at 88 % of predicted.  Assessment and Plan:   1. Not well controlled severe persistent asthma   2. Other allergic rhinitis   3. LPRD (laryngopharyngeal reflux disease)   4. Respiratory tract infection  5. Iron deficiency anemia, unspecified iron deficiency anemia type    1. Continue reflux therapy:   A.  Omeprazole 40 mg - 1 tablet in AM  B.  Famotidine 40 mg - 1 tablet in PM  2. Continue anti-inflammatory therapy for chest:    A. Fluticasone 110 - 2 inhalations 2 times per day w/ spacer   B. montelukast 10 mg daily  3. Continue anti-inflammatory therapy for nose:   A. Afrin - 1 spray in single nostril in evening. Alternate nostrils  B. flonase + astelin - 1 spray each in both nostrils in evening  D. flonase + astelin - 1 spray each in both nostrils in morning  4. If Needed:   A. Proventil HFA or Xopenex HFA if needed  B. Zyrtec one tablet one time per day   C. nasal saline multiple times a day   D. Mucinex DM 2 tablets twice a day   5. "Action Plan" for flare up:   A. Increase Flovent to 3 inhalations 3 times per day  B. Use Albuterol HFA if needed  6. For this respiratory event:   A. Azithromycin 500 mg - 1 tablet 1 time per day for 3 days  B. Prednisone 10 mg - 1 tablet 1 time per day for 3 days  7. For low hemoglobin:   A. Blood - iron panel, reticulocyte count.  B. May need to see GI for scopes   8. Return in 6 months or earlier if problem  9. Obtain flu vaccine  Damarko definitely has some issues that need attention.  He appears to have a respiratory tract infection and he just does not feel good in general and we will cover him for mycoplasma with azithromycin  and I have given him just 3 days of steroids to help with some of the inflammation associated with this infection.  He will continue to use anti-inflammatory agents for his airway as noted above which has actually been working very well regarding his respiratory tract.  And he will continue to treat reflux which has also been working quite well with a combination of a proton pump inhibitor and H2 receptor blocker.  He has some form of anemia, etiology not entirely clear.  We will check a reticulocyte count and an iron panel and if these are normal other than low iron then he may not actually be losing blood through his GI track and there may be another etiologic factor responsible for his anemia such as inability to absorb iron.  Certainly if his reticulocyte count is up or his iron/ferritin levels are low then he is going to need evaluation of his GI tract.  I will contact him with the results of his blood test once they are available for review.  Laurette Schimke, MD Allergy / Immunology [facility]

## 2023-05-20 NOTE — Patient Instructions (Addendum)
  1. Continue reflux therapy:   A.  Omeprazole 40 mg - 1 tablet in AM  B.  Famotidine 40 mg - 1 tablet in PM  2. Continue anti-inflammatory therapy for chest:    A. Fluticasone 110 - 2 inhalations 2 times per day w/ spacer   B. montelukast 10 mg daily  3. Continue anti-inflammatory therapy for nose:   A. Afrin - 1 spray in single nostril in evening. Alternate nostrils  B. flonase + astelin - 1 spray each in both nostrils in evening  D. flonase + astelin - 1 spray each in both nostrils in morning  4. If Needed:   A. Proventil HFA or Xopenex HFA if needed  B. Zyrtec one tablet one time per day   C. nasal saline multiple times a day   D. Mucinex DM 2 tablets twice a day   5. "Action Plan" for flare up:   A. Increase Flovent to 3 inhalations 3 times per day  B. Use Albuterol HFA if needed  6. For this respiratory event:   A. Azithromycin 500 mg - 1 tablet 1 time per day for 3 days  B. Prednisone 10 mg - 1 tablet 1 time per day for 3 days  7. For low hemoglobin:   A. Blood - iron panel, reticulocyte count.  B. May need to see GI for scopes   8. Return in 6 months or earlier if problem  9. Obtain flu vaccine

## 2023-05-21 LAB — IRON,TIBC AND FERRITIN PANEL
Ferritin: 39 ng/mL (ref 30–400)
Iron Saturation: 8 % — CL (ref 15–55)
Iron: 31 ug/dL — ABNORMAL LOW (ref 38–169)
Total Iron Binding Capacity: 367 ug/dL (ref 250–450)
UIBC: 336 ug/dL (ref 111–343)

## 2023-05-21 LAB — RETICULOCYTES: Retic Ct Pct: 1.8 % (ref 0.6–2.6)

## 2023-05-24 ENCOUNTER — Encounter: Payer: Self-pay | Admitting: Allergy and Immunology

## 2023-05-25 ENCOUNTER — Other Ambulatory Visit: Payer: Self-pay | Admitting: *Deleted

## 2023-05-25 DIAGNOSIS — D509 Iron deficiency anemia, unspecified: Secondary | ICD-10-CM

## 2023-05-26 ENCOUNTER — Encounter: Payer: Self-pay | Admitting: Oncology

## 2023-05-26 ENCOUNTER — Inpatient Hospital Stay: Payer: Medicare PPO

## 2023-05-26 ENCOUNTER — Inpatient Hospital Stay: Payer: Medicare PPO | Attending: Oncology | Admitting: Oncology

## 2023-05-26 VITALS — BP 138/79 | HR 77 | Temp 97.9°F | Resp 18 | Ht 68.0 in | Wt 201.7 lb

## 2023-05-26 DIAGNOSIS — Z7951 Long term (current) use of inhaled steroids: Secondary | ICD-10-CM | POA: Diagnosis not present

## 2023-05-26 DIAGNOSIS — Z87891 Personal history of nicotine dependence: Secondary | ICD-10-CM | POA: Diagnosis not present

## 2023-05-26 DIAGNOSIS — I10 Essential (primary) hypertension: Secondary | ICD-10-CM | POA: Diagnosis not present

## 2023-05-26 DIAGNOSIS — J449 Chronic obstructive pulmonary disease, unspecified: Secondary | ICD-10-CM | POA: Diagnosis not present

## 2023-05-26 DIAGNOSIS — I4891 Unspecified atrial fibrillation: Secondary | ICD-10-CM | POA: Diagnosis not present

## 2023-05-26 DIAGNOSIS — R079 Chest pain, unspecified: Secondary | ICD-10-CM | POA: Diagnosis not present

## 2023-05-26 DIAGNOSIS — Z79899 Other long term (current) drug therapy: Secondary | ICD-10-CM | POA: Insufficient documentation

## 2023-05-26 DIAGNOSIS — R0602 Shortness of breath: Secondary | ICD-10-CM | POA: Diagnosis not present

## 2023-05-26 DIAGNOSIS — D509 Iron deficiency anemia, unspecified: Secondary | ICD-10-CM

## 2023-05-26 DIAGNOSIS — N189 Chronic kidney disease, unspecified: Secondary | ICD-10-CM | POA: Insufficient documentation

## 2023-05-26 DIAGNOSIS — K219 Gastro-esophageal reflux disease without esophagitis: Secondary | ICD-10-CM | POA: Insufficient documentation

## 2023-05-26 LAB — CBC WITH DIFFERENTIAL (CANCER CENTER ONLY)
Abs Immature Granulocytes: 0.06 10*3/uL (ref 0.00–0.07)
Basophils Absolute: 0.1 10*3/uL (ref 0.0–0.1)
Basophils Relative: 1 %
Eosinophils Absolute: 0.2 10*3/uL (ref 0.0–0.5)
Eosinophils Relative: 2 %
HCT: 33.5 % — ABNORMAL LOW (ref 39.0–52.0)
Hemoglobin: 10.4 g/dL — ABNORMAL LOW (ref 13.0–17.0)
Immature Granulocytes: 1 %
Lymphocytes Relative: 16 %
Lymphs Abs: 1.7 10*3/uL (ref 0.7–4.0)
MCH: 24.6 pg — ABNORMAL LOW (ref 26.0–34.0)
MCHC: 31 g/dL (ref 30.0–36.0)
MCV: 79.2 fL — ABNORMAL LOW (ref 80.0–100.0)
Monocytes Absolute: 1.2 10*3/uL — ABNORMAL HIGH (ref 0.1–1.0)
Monocytes Relative: 11 %
Neutro Abs: 7.7 10*3/uL (ref 1.7–7.7)
Neutrophils Relative %: 69 %
Platelet Count: 459 10*3/uL — ABNORMAL HIGH (ref 150–400)
RBC: 4.23 MIL/uL (ref 4.22–5.81)
RDW: 16.2 % — ABNORMAL HIGH (ref 11.5–15.5)
WBC Count: 10.9 10*3/uL — ABNORMAL HIGH (ref 4.0–10.5)
nRBC: 0 % (ref 0.0–0.2)
nRBC: 0 /100{WBCs}

## 2023-05-26 LAB — IRON AND TIBC
Iron: 23 ug/dL — ABNORMAL LOW (ref 45–182)
Saturation Ratios: 5 % — ABNORMAL LOW (ref 17.9–39.5)
TIBC: 480 ug/dL — ABNORMAL HIGH (ref 250–450)
UIBC: 457 ug/dL

## 2023-05-26 LAB — CMP (CANCER CENTER ONLY)
ALT: 23 U/L (ref 0–44)
AST: 25 U/L (ref 15–41)
Albumin: 3.9 g/dL (ref 3.5–5.0)
Alkaline Phosphatase: 65 U/L (ref 38–126)
Anion gap: 14 (ref 5–15)
BUN: 18 mg/dL (ref 8–23)
CO2: 23 mmol/L (ref 22–32)
Calcium: 10.1 mg/dL (ref 8.9–10.3)
Chloride: 102 mmol/L (ref 98–111)
Creatinine: 1.47 mg/dL — ABNORMAL HIGH (ref 0.61–1.24)
GFR, Estimated: 48 mL/min — ABNORMAL LOW (ref >60)
Glucose, Bld: 103 mg/dL — ABNORMAL HIGH (ref 70–99)
Potassium: 3.9 mmol/L (ref 3.5–5.1)
Sodium: 139 mmol/L (ref 135–145)
Total Bilirubin: 0.3 mg/dL (ref <1.2)
Total Protein: 7.3 g/dL (ref 6.5–8.1)

## 2023-05-26 LAB — VITAMIN B12: Vitamin B-12: 906 pg/mL (ref 180–914)

## 2023-05-26 LAB — VITAMIN D 25 HYDROXY (VIT D DEFICIENCY, FRACTURES): Vit D, 25-Hydroxy: 38.1 ng/mL (ref 30–100)

## 2023-05-26 LAB — FERRITIN: Ferritin: 22 ng/mL — ABNORMAL LOW (ref 24–336)

## 2023-05-26 LAB — FOLATE: Folate: 9.7 ng/mL (ref >5.9)

## 2023-05-26 MED ORDER — AMOXICILLIN-POT CLAVULANATE 875-125 MG PO TABS: 1.0000 | ORAL_TABLET | Freq: Two times a day (BID) | ORAL | 0 refills | Status: DC

## 2023-05-26 NOTE — Progress Notes (Signed)
[facility] CONSULT NOTE  Patient Care Team: Paulina Fusi, MD as PCP - General (Internal Medicine)  CHIEF COMPLAINTS/PURPOSE OF CONSULTATION:  Anemia   HISTORY OF PRESENTING ILLNESS:  Bob Newman 77 y.o. male was referred to hematology for iron deficiency anemia.  Labs from 05/20/2023 show iron saturation of 8%, ferritin 39 and total iron of 31.  I do not see a CBC.  On further chart review, it looks like he has had a lower ferritin level since as far back as November 2023.  Ferritin level of 36 and transferrin 341.  Iron saturation was 9% with a TIBC of 477.  Most recent CBC is from November 2023 which was normal at 13.5.  Normal platelet counts and MCV 81.2. Hemoglobin 10.1 (12.3).   Reports being anemic in the past.  States he has been taking iron tablet 1 a day for over 10 years.  He is currently taking iron twice daily to see if that would help his levels.  He denies any bleeding.  States he has had a colonoscopy but it has been many years back.  He did recently (June 2024) have a negative stool occult.  Reports a fairly normal American diet.  Eats meats, vegetables and fruits.  His weight is stable.  Reports occasional chest pain with exertion and some shortness of breath.  Has history of COPD and asthma.  He was given 3 prednisone and 3 azithromycin's last week which she took and did not help his cough.  Reports he is a nondrinker and non-smoker.  Reports he does chew tobacco.  He denies any ice pica.  Screenings are up-to-date.  No prior histories or diagnoses of cancer.  He is not on any antiplatelet agents.  He does not excessively use NSAIDs.  He has not received a blood transfusion.  He does not donate blood.   MEDICAL HISTORY:  Past Medical History:  Diagnosis Date   Allergic rhinoconjunctivitis    Asthma    Atrial fibrillation (HCC)    GERD (gastroesophageal reflux disease)    High blood pressure     SURGICAL HISTORY: Past Surgical History:  Procedure Laterality  Date   CHOLECYSTECTOMY     THROMBOENDARTERECTOMY Right 09/22/2016   Boykin Reaper, MD at [facility]   TONSILLECTOMY      SOCIAL HISTORY: Social History   Socioeconomic History   Marital status: Married    Spouse name: Lynden Ang   Number of children: 1   Years of education: 7th grade   Highest education level: 7th grade  Occupational History    Comment: Retired  Tobacco Use   Smoking status: Former   Smokeless tobacco: Current    Types: Engineer, drilling   Vaping status: Never Used  Substance and Sexual Activity   Alcohol use: Not Currently    Comment: former drinker   Drug use: Never   Sexual activity: Not Currently  Other Topics Concern   Not on file  Social History Narrative   Not on file   Social Drivers of Health   Financial Resource Strain: Low Risk  (04/14/2022)   Received from [facility], [facility]   Financial Resource Strain    In the past year, have you or any family members you live with been unable to get any of the following when it was really needed? : No  Food Insecurity: Not on file  Transportation Needs: Low Risk  (01/14/2022)   Received from [facility], [place]  [facility]   Transportation Needs    Has lack of transportation kept you from medical appointments, meetings, work, or from getting things needed for daily living? : No  Physical Activity: Not on file  Stress: Not on file  Social Connections: Low Risk  (01/14/2022)   Received from [facility], [facility]   Social Connections    How often do you see or talk to people that you care about and feel close to? (For example: talking to friends on the phone, visiting friends or family, going to church or club meetings): 5 or more times a week  Intimate Partner Violence: Low Risk  (04/14/2022)   Received from [facility], [facility]    Intimate Partner Violence    In the past year, have you been afraid of your partner or ex-partner?: No    FAMILY HISTORY: Family History  Problem Relation Age of Onset   Allergic rhinitis Neg Hx    Asthma Neg Hx    Urticaria Neg Hx     ALLERGIES:  is allergic to latex, levofloxacin, spiriva handihaler [tiotropium bromide monohydrate], and polyethylene glycol.  MEDICATIONS:  Current Outpatient Medications  Medication Sig Dispense Refill   albuterol (VENTOLIN HFA) 108 (90 Base) MCG/ACT inhaler 2 inhalations every 4-6 hours as needed 18 g 1   amLODipine (NORVASC) 5 MG tablet 5 mg daily.     azelastine (ASTELIN) 0.1 % nasal spray Use one spray in each nostril twice daily as directed. 90 mL 1   candesartan (ATACAND) 16 MG tablet Take 16 mg by mouth daily.     CRESTOR 20 MG tablet Take 20 mg by mouth daily.      cyanocobalamin (VITAMIN B12) 500 MCG tablet Take by mouth.     dicyclomine (BENTYL) 10 MG capsule Take 10 mg by mouth every 6 (six) hours as needed.     dronedarone (MULTAQ) 400 MG tablet Take 400 mg by mouth 2 (two) times daily with a meal.     ELIQUIS 5 MG TABS tablet      famotidine (PEPCID) 40 MG tablet Take 1 tablet (40 mg total) by mouth at bedtime. 90 tablet 1   ferrous sulfate 325 (65 FE) MG tablet Take by mouth.     fluticasone (FLONASE) 50 MCG/ACT nasal spray One spray in both nostrils in the morning and the evening 48 g 1   fluticasone (FLOVENT HFA) 110 MCG/ACT inhaler Inhale 2 puffs into the lungs 2 (two) times daily. With spacer. 36 g 1   furosemide (LASIX) 40 MG tablet Take by mouth.     magnesium oxide (MAG-OX) 400 MG tablet Take by mouth.     metoprolol succinate (TOPROL-XL) 50 MG 24 hr tablet Take by mouth.     metoprolol tartrate (LOPRESSOR) 50 MG tablet Take 50 mg by mouth daily.     montelukast (SINGULAIR) 10 MG tablet TAKE ONE TABLET ONCE DAILY 90 tablet 1   nitroGLYCERIN (NITROSTAT) 0.4 MG SL tablet      omeprazole (PRILOSEC) 40 MG capsule TAKE ONE  CAPSULE (40 MG TOTAL) BY MOUTH EVERY MORNING AS DIRECTED. 90 capsule 1   No current facility-administered medications for this visit.    REVIEW OF SYSTEMS:   Review of Systems  Constitutional:  Positive for malaise/fatigue. Negative for weight loss.  Respiratory:  Positive for cough and shortness of breath.   Cardiovascular:  Positive for chest pain.  Gastrointestinal:  Positive for heartburn. Negative for  abdominal pain, blood in stool, constipation, diarrhea and melena.  Musculoskeletal:  Positive for joint pain.  Neurological:  Positive for dizziness. Negative for weakness and headaches.    PHYSICAL EXAMINATION: ECOG PERFORMANCE STATUS: 1 - Symptomatic but completely ambulatory  Vitals:   05/26/23 1355  BP: 138/79  Pulse: 77  Resp: 18  Temp: 97.9 F (36.6 C)  SpO2: 98%   Filed Weights   05/26/23 1355  Weight: 201 lb 11.2 oz (91.5 kg)   Physical Exam Constitutional:      Appearance: Normal appearance.  Cardiovascular:     Rate and Rhythm: Normal rate and regular rhythm.  Pulmonary:     Effort: Pulmonary effort is normal.     Breath sounds: Normal breath sounds.  Abdominal:     General: Bowel sounds are normal.     Palpations: Abdomen is soft.  Musculoskeletal:        General: No swelling. Normal range of motion.  Neurological:     Mental Status: He is alert and oriented to person, place, and time. Mental status is at baseline.     LABORATORY DATA:  I have reviewed the data as listed Recent Results (from the past 2160 hours)  C-reactive protein     Status: Abnormal   Collection Time: 03/26/23 12:16 PM  Result Value Ref Range   CRP 20.0 (H) <8.0 mg/L  Sedimentation rate     Status: Abnormal   Collection Time: 03/26/23 12:16 PM  Result Value Ref Range   Sed Rate 33 (H) 0 - 20 mm/h  Mutated Citrullinated Vimentin (MCV) Antibody     Status: None   Collection Time: 03/26/23 12:16 PM  Result Value Ref Range   MUTATED CITRULLINATED VIMENTIN (MCV) AB <20 <20  U/mL    Comment: . Anti-mutated citrullinated vimentin antibody may be used as a second-line marker of rheumatoid arthritis, in addition to rheumatoid factor and anti-cyclic citrullinated peptide (CCP). .   Fe+TIBC+Fer     Status: Abnormal   Collection Time: 05/20/23  2:43 PM  Result Value Ref Range   Total Iron Binding Capacity 367 250 - 450 ug/dL   UIBC 811 914 - 782 ug/dL   Iron 31 (L) 38 - 956 ug/dL   Iron Saturation 8 (LL) 15 - 55 %   Ferritin 39 30 - 400 ng/mL  Reticulocytes     Status: None   Collection Time: 05/20/23  2:43 PM  Result Value Ref Range   Retic Ct Pct 1.8 0.6 - 2.6 %    RADIOGRAPHIC STUDIES: I have personally reviewed the radiological images as listed and agreed with the findings in the report. No results found.  ASSESSMENT & PLAN:  No problem-specific Assessment & Plan notes found for this encounter.  1. Iron deficiency anemia, unspecified iron deficiency anemia type (Primary) -Discussed labs today to rule out other nutritional deficiencies.  Based on lab work from previous provider from 05/20/23 he is iron deficient.  He provided me additional lab work from 05/05/2023 which shows a hemoglobin of 10.1, MCV 85.  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 -We discussed differentials for his anemia including relative iron deficiency, CKD, B12 deficiency versus other.  -We discussed going ahead and preauth him to get IV iron in the next week or so.  Recommend IV Venofer 1 g given in divided doses if insurance will approve given we see less reactions with IV Venofer. -We discussed replacing his iron and seeing him back in approximately 3 months with repeat lab work.  -  I discussed I will call him if his labs return and additional supplements may be needed.  - CBC with Differential/Platelet - Comprehensive metabolic panel - Ferritin; Future - Iron and TIBC ([facility] ONLY); Future - Vitamin B12; Future - Copper, serum; Future - Folate; Future -  Vitamin D 25 hydroxy; Future    All questions were answered. The patient knows to call the clinic with any problems, questions or concerns. I spent 30 minutes counseling the patient face to face. The total time spent in the appointment was 30 minutes and more than 50% was on counseling.     Mauro Kaufmann, NP 05/26/23 2:04 PM

## 2023-05-26 NOTE — Addendum Note (Signed)
Addended by: Durenda Hurt E on: 05/26/2023 03:12 PM   Modules accepted: Orders

## 2023-05-27 LAB — COPPER, SERUM: Copper: 122 ug/dL (ref 69–132)

## 2023-05-28 ENCOUNTER — Inpatient Hospital Stay: Payer: Medicare PPO

## 2023-05-28 VITALS — BP 133/79 | HR 71 | Temp 97.7°F | Resp 18 | Ht 68.0 in | Wt 202.0 lb

## 2023-05-28 DIAGNOSIS — D509 Iron deficiency anemia, unspecified: Secondary | ICD-10-CM | POA: Diagnosis not present

## 2023-05-28 MED ORDER — IRON SUCROSE 20 MG/ML IV SOLN
200.0000 mg | Freq: Once | INTRAVENOUS | Status: AC
  Administered 2023-05-28: 200 mg via INTRAVENOUS
  Filled 2023-05-28: qty 10

## 2023-05-28 MED ORDER — SODIUM CHLORIDE 0.9% FLUSH
10.0000 mL | Freq: Once | INTRAVENOUS | Status: AC | PRN
Start: 2023-05-28 — End: 2023-05-28
  Administered 2023-05-28: 10 mL

## 2023-05-28 NOTE — Patient Instructions (Signed)
Iron Sucrose Injection What is this medication? IRON SUCROSE (EYE ern SOO krose) treats low levels of iron (iron deficiency anemia) in people with kidney disease. Iron is a mineral that plays an important role in making red blood cells, which carry oxygen from your lungs to the rest of your body. This medicine may be used for other purposes; ask your health care provider or pharmacist if you have questions. COMMON BRAND NAME(S): Venofer What should I tell my care team before I take this medication? They need to know if you have any of these conditions: Anemia not caused by low iron levels Heart disease High levels of iron in the blood Kidney disease Liver disease An unusual or allergic reaction to iron, other medications, foods, dyes, or preservatives Pregnant or trying to get pregnant Breastfeeding How should I use this medication? This medication is for infusion into a vein. It is given in a hospital or clinic setting. Talk to your care team about the use of this medication in children. While this medication may be prescribed for children as young as 2 years for selected conditions, precautions do apply. Overdosage: If you think you have taken too much of this medicine contact a poison control center or emergency room at once. NOTE: This medicine is only for you. Do not share this medicine with others. What if I miss a dose? Keep appointments for follow-up doses. It is important not to miss your dose. Call your care team if you are unable to keep an appointment. What may interact with this medication? Do not take this medication with any of the following: Deferoxamine Dimercaprol Other iron products This medication may also interact with the following: Chloramphenicol Deferasirox This list may not describe all possible interactions. Give your health care provider a list of all the medicines, herbs, non-prescription drugs, or dietary supplements you use. Also tell them if you smoke,  drink alcohol, or use illegal drugs. Some items may interact with your medicine. What should I watch for while using this medication? Visit your care team regularly. Tell your care team if your symptoms do not start to get better or if they get worse. You may need blood work done while you are taking this medication. You may need to follow a special diet. Talk to your care team. Foods that contain iron include: whole grains/cereals, dried fruits, beans, or peas, leafy green vegetables, and organ meats (liver, kidney). What side effects may I notice from receiving this medication? Side effects that you should report to your care team as soon as possible: Allergic reactions--skin rash, itching, hives, swelling of the face, lips, tongue, or throat Low blood pressure--dizziness, feeling faint or lightheaded, blurry vision Shortness of breath Side effects that usually do not require medical attention (report to your care team if they continue or are bothersome): Flushing Headache Joint pain Muscle pain Nausea Pain, redness, or irritation at injection site This list may not describe all possible side effects. Call your doctor for medical advice about side effects. You may report side effects to FDA at 1-800-FDA-1088. Where should I keep my medication? This medication is given in a hospital or clinic. It will not be stored at home. NOTE: This sheet is a summary. It may not cover all possible information. If you have questions about this medicine, talk to your doctor, pharmacist, or health care provider.  2024 Elsevier/Gold Standard (2022-10-30 00:00:00)

## 2023-05-31 ENCOUNTER — Other Ambulatory Visit: Payer: Self-pay | Admitting: Pharmacist

## 2023-05-31 ENCOUNTER — Inpatient Hospital Stay: Payer: Medicare PPO

## 2023-05-31 VITALS — BP 118/72 | HR 60 | Temp 97.9°F | Resp 16

## 2023-05-31 DIAGNOSIS — D509 Iron deficiency anemia, unspecified: Secondary | ICD-10-CM

## 2023-05-31 MED ORDER — IRON SUCROSE 20 MG/ML IV SOLN
200.0000 mg | Freq: Once | INTRAVENOUS | Status: AC
  Administered 2023-05-31: 200 mg via INTRAVENOUS
  Filled 2023-05-31: qty 10

## 2023-05-31 NOTE — Patient Instructions (Signed)
Iron Sucrose Injection What is this medication? IRON SUCROSE (EYE ern SOO krose) treats low levels of iron (iron deficiency anemia) in people with kidney disease. Iron is a mineral that plays an important role in making red blood cells, which carry oxygen from your lungs to the rest of your body. This medicine may be used for other purposes; ask your health care provider or pharmacist if you have questions. COMMON BRAND NAME(S): Venofer What should I tell my care team before I take this medication? They need to know if you have any of these conditions: Anemia not caused by low iron levels Heart disease High levels of iron in the blood Kidney disease Liver disease An unusual or allergic reaction to iron, other medications, foods, dyes, or preservatives Pregnant or trying to get pregnant Breastfeeding How should I use this medication? This medication is for infusion into a vein. It is given in a hospital or clinic setting. Talk to your care team about the use of this medication in children. While this medication may be prescribed for children as young as 2 years for selected conditions, precautions do apply. Overdosage: If you think you have taken too much of this medicine contact a poison control center or emergency room at once. NOTE: This medicine is only for you. Do not share this medicine with others. What if I miss a dose? Keep appointments for follow-up doses. It is important not to miss your dose. Call your care team if you are unable to keep an appointment. What may interact with this medication? Do not take this medication with any of the following: Deferoxamine Dimercaprol Other iron products This medication may also interact with the following: Chloramphenicol Deferasirox This list may not describe all possible interactions. Give your health care provider a list of all the medicines, herbs, non-prescription drugs, or dietary supplements you use. Also tell them if you smoke,  drink alcohol, or use illegal drugs. Some items may interact with your medicine. What should I watch for while using this medication? Visit your care team regularly. Tell your care team if your symptoms do not start to get better or if they get worse. You may need blood work done while you are taking this medication. You may need to follow a special diet. Talk to your care team. Foods that contain iron include: whole grains/cereals, dried fruits, beans, or peas, leafy green vegetables, and organ meats (liver, kidney). What side effects may I notice from receiving this medication? Side effects that you should report to your care team as soon as possible: Allergic reactions--skin rash, itching, hives, swelling of the face, lips, tongue, or throat Low blood pressure--dizziness, feeling faint or lightheaded, blurry vision Shortness of breath Side effects that usually do not require medical attention (report to your care team if they continue or are bothersome): Flushing Headache Joint pain Muscle pain Nausea Pain, redness, or irritation at injection site This list may not describe all possible side effects. Call your doctor for medical advice about side effects. You may report side effects to FDA at 1-800-FDA-1088. Where should I keep my medication? This medication is given in a hospital or clinic. It will not be stored at home. NOTE: This sheet is a summary. It may not cover all possible information. If you have questions about this medicine, talk to your doctor, pharmacist, or health care provider.  2024 Elsevier/Gold Standard (2022-10-30 00:00:00)

## 2023-06-03 ENCOUNTER — Inpatient Hospital Stay: Payer: Medicare PPO

## 2023-06-03 VITALS — BP 137/74 | HR 76 | Temp 98.1°F | Resp 18

## 2023-06-03 DIAGNOSIS — D509 Iron deficiency anemia, unspecified: Secondary | ICD-10-CM

## 2023-06-03 MED ORDER — IRON SUCROSE 20 MG/ML IV SOLN
200.0000 mg | Freq: Once | INTRAVENOUS | Status: AC
  Administered 2023-06-03: 200 mg via INTRAVENOUS
  Filled 2023-06-03: qty 10

## 2023-06-03 NOTE — Patient Instructions (Signed)
Iron Sucrose Injection What is this medication? IRON SUCROSE (EYE ern SOO krose) treats low levels of iron (iron deficiency anemia) in people with kidney disease. Iron is a mineral that plays an important role in making red blood cells, which carry oxygen from your lungs to the rest of your body. This medicine may be used for other purposes; ask your health care provider or pharmacist if you have questions. COMMON BRAND NAME(S): Venofer What should I tell my care team before I take this medication? They need to know if you have any of these conditions: Anemia not caused by low iron levels Heart disease High levels of iron in the blood Kidney disease Liver disease An unusual or allergic reaction to iron, other medications, foods, dyes, or preservatives Pregnant or trying to get pregnant Breastfeeding How should I use this medication? This medication is for infusion into a vein. It is given in a hospital or clinic setting. Talk to your care team about the use of this medication in children. While this medication may be prescribed for children as young as 2 years for selected conditions, precautions do apply. Overdosage: If you think you have taken too much of this medicine contact a poison control center or emergency room at once. NOTE: This medicine is only for you. Do not share this medicine with others. What if I miss a dose? Keep appointments for follow-up doses. It is important not to miss your dose. Call your care team if you are unable to keep an appointment. What may interact with this medication? Do not take this medication with any of the following: Deferoxamine Dimercaprol Other iron products This medication may also interact with the following: Chloramphenicol Deferasirox This list may not describe all possible interactions. Give your health care provider a list of all the medicines, herbs, non-prescription drugs, or dietary supplements you use. Also tell them if you smoke,  drink alcohol, or use illegal drugs. Some items may interact with your medicine. What should I watch for while using this medication? Visit your care team regularly. Tell your care team if your symptoms do not start to get better or if they get worse. You may need blood work done while you are taking this medication. You may need to follow a special diet. Talk to your care team. Foods that contain iron include: whole grains/cereals, dried fruits, beans, or peas, leafy green vegetables, and organ meats (liver, kidney). What side effects may I notice from receiving this medication? Side effects that you should report to your care team as soon as possible: Allergic reactions--skin rash, itching, hives, swelling of the face, lips, tongue, or throat Low blood pressure--dizziness, feeling faint or lightheaded, blurry vision Shortness of breath Side effects that usually do not require medical attention (report to your care team if they continue or are bothersome): Flushing Headache Joint pain Muscle pain Nausea Pain, redness, or irritation at injection site This list may not describe all possible side effects. Call your doctor for medical advice about side effects. You may report side effects to FDA at 1-800-FDA-1088. Where should I keep my medication? This medication is given in a hospital or clinic. It will not be stored at home. NOTE: This sheet is a summary. It may not cover all possible information. If you have questions about this medicine, talk to your doctor, pharmacist, or health care provider.  2024 Elsevier/Gold Standard (2022-10-30 00:00:00)

## 2023-06-07 ENCOUNTER — Inpatient Hospital Stay: Payer: Medicare PPO

## 2023-06-07 VITALS — BP 112/81 | HR 65 | Temp 98.0°F | Resp 20 | Ht 68.0 in | Wt 201.0 lb

## 2023-06-07 DIAGNOSIS — D509 Iron deficiency anemia, unspecified: Secondary | ICD-10-CM

## 2023-06-07 MED ORDER — EPINEPHRINE 0.3 MG/0.3ML IJ SOAJ
0.3000 mg | Freq: Once | INTRAMUSCULAR | Status: DC | PRN
Start: 2023-06-07 — End: 2023-06-07

## 2023-06-07 MED ORDER — METHYLPREDNISOLONE SODIUM SUCC 125 MG IJ SOLR
125.0000 mg | Freq: Once | INTRAMUSCULAR | Status: DC | PRN
Start: 2023-06-07 — End: 2023-06-07

## 2023-06-07 MED ORDER — IRON SUCROSE 20 MG/ML IV SOLN
200.0000 mg | Freq: Once | INTRAVENOUS | Status: AC
  Administered 2023-06-07: 200 mg via INTRAVENOUS
  Filled 2023-06-07: qty 10

## 2023-06-07 MED ORDER — ALBUTEROL SULFATE HFA 108 (90 BASE) MCG/ACT IN AERS: 2.0000 | INHALATION_SPRAY | Freq: Once | RESPIRATORY_TRACT | Status: DC | PRN

## 2023-06-07 MED ORDER — FAMOTIDINE IN NACL 20-0.9 MG/50ML-% IV SOLN
20.0000 mg | Freq: Once | INTRAVENOUS | Status: DC | PRN
Start: 2023-06-07 — End: 2023-06-07

## 2023-06-07 MED ORDER — DIPHENHYDRAMINE HCL 50 MG/ML IJ SOLN
50.0000 mg | Freq: Once | INTRAMUSCULAR | Status: DC | PRN
Start: 2023-06-07 — End: 2023-06-07

## 2023-06-07 MED ORDER — SODIUM CHLORIDE 0.9% FLUSH
10.0000 mL | Freq: Once | INTRAVENOUS | Status: AC | PRN
  Administered 2023-06-07: 10 mL

## 2023-06-07 NOTE — Patient Instructions (Signed)
 Iron Sucrose Injection Quel est ce mdicament ? Le FER-SACCHAROSE traite les faibles taux de fer (anmie ferriprive) chez les personnes atteintes d'une maladie rnale. Le fer est un minral qui joue un rle important dans la production des globules rouges qui transportent l'oxygne des poumons vers les autres organes. Ce mdicament peut tre utilis pour d'autres indications ; adressez-vous  votre mdecin ou  votre pharmacien si vous [employer] questions. NOM(S) DE MARQUE COURANT(S) : Venofer Que dois-je dire  mon fournisseur de [employer] de prendre ce mdicament ? Votre quipe de soins a besoin de savoir si vous tes dans l?une des situations suivantes : Anmie non due  de faibles taux de Contractor cardiaque Taux levs de fer dans le sang [place] rnale [place] du foie Raction inhabituelle ou allergique au fer,  d?autres mdicaments,  des aliments,  des colorants ou  des conservateurs [place] ou projet de grossesse Allaitement Comment dois-je utiliser ce mdicament ? Ce mdicament est destin  tre administr par perfusion dans une veine. Il est administr dans un tablissement [employer] ou Biomedical scientist. Pour toute information relative  l'utilisation de ce mdicament [employer], consultez votre quipe de [employer]. Bien que ce mdicament puisse tre prescrit, pour certaines affections,  des enfants gs de 2 ans seulement, des prcautions s?imposent. Surdosage : si vous pensez avoir pris ce mdicament en excs, contactez immdiatement un centre [employer].<br />REMARQUE : ce mdicament vous est uniquement destin. Ne partagez pas ce Arts administrator. Que faire si je saute une dose ? Prsentez-vous  tous les rendez-vous pour recevoir les doses suivantes. Il est important de ne pas omettre votre dose. Appelez votre quipe de soins si vous ne pouvez pas Barrister's clerk. Quelles sont les interactions possibles avec ce  mdicament ? Ne prenez pas ce [employer] substances suivantes : Dfroxamine Dimercaprol Autres produits  base de fer Ce mdicament peut galement interagir avec les substances suivantes : Chloramphnicol Dfrasirox Cette liste peut ne pas dcrire toutes les interactions mdicamenteuses possibles. Donnez  votre fournisseur de [employer] de tous les mdicaments, plantes mdicinales, mdicaments en vente libre ou complments alimentaires que vous prenez. Dites-lui aussi si vous fumez, buvez de l'alcool ou consommez des drogues illicites. Certaines substances peuvent interagir Writer. Que dois-je IT consultant par ce mdicament ? Prenez rendez-vous rgulirement [employer] quipe de [place]. Informez votre quipe de soins si vos symptmes ne commencent pas  s?amliorer ou s?ils s?aggravent. Vous devrez Geographical information systems officer des analyses de sang Art therapist par [employer]. Vous devrez ventuellement suivre un rgime alimentaire spcial. Consultez votre quipe de soins. Les aliments contenant du fer incluent les crales compltes, les fruits secs, les haricots, les pois, les lgumes verts  feuilles et les abats [employer], rein). Quels effets secondaires puis-je remarquer en prenant ce mdicament ? Effets secondaires que vous devez signaler  votre quipe de soins ds que possible : Ractions allergiques : ruption cutane, dmangeaisons, urticaire, gonflement du visage, des lvres, de la langue ou de la gorge Hypotension artrielle : vertiges, sensation d?vanouissement ou d?tourdissement, vision trouble [place] Effets secondaires ne ncessitant gnralement pas un avis mdical (signalez-les  votre quipe de soins s'ils persistent ou sont gnants) : Bouffes congestives Mal de tte Douleurs articulaires Douleurs musculaires Nauses Douleur, rougeur ou irritation au site d?injection Curator ne pas dcrire tous les  effets secondaires possibles. Appelez votre mdecin pour [employer] sujet des [employer].  Vous pouvez signaler les effets secondaires  la FDA, au [phone]. O dois-je conserver mon mdicament ? Ce mdicament est administr dans un tablissement [employer] ou un cabinet mdical et ne sera pas conserv  domicile. <b>REMARQUE : cette notice est un rsum. Elle peut ne pas contenir toutes les informations possibles. Si vous avez des questions  propos de ce mdicament, Patent attorney mdecin, votre pharmacien ou votre fournisseur de [place].</b>  2024 Elsevier/Gold Standard (2022-04-15 00:00:00)

## 2023-06-07 NOTE — Progress Notes (Signed)
Patient reports pain at IV site after discontinuation- Slight redness noted to site- Warm pack applied, patient reports relief.

## 2023-06-07 NOTE — Progress Notes (Signed)
Patient states that pain is relieved when Coban removed. Coban replaced with bandaid-

## 2023-06-10 ENCOUNTER — Inpatient Hospital Stay: Payer: Medicare PPO | Attending: Oncology

## 2023-06-10 VITALS — BP 124/69 | HR 63 | Temp 97.6°F | Resp 20 | Wt 203.0 lb

## 2023-06-10 DIAGNOSIS — Z79899 Other long term (current) drug therapy: Secondary | ICD-10-CM | POA: Insufficient documentation

## 2023-06-10 DIAGNOSIS — D509 Iron deficiency anemia, unspecified: Secondary | ICD-10-CM | POA: Diagnosis present

## 2023-06-10 MED ORDER — IRON SUCROSE 20 MG/ML IV SOLN
200.0000 mg | Freq: Once | INTRAVENOUS | Status: AC
  Administered 2023-06-10: 200 mg via INTRAVENOUS
  Filled 2023-06-10: qty 10

## 2023-06-10 NOTE — Telephone Encounter (Signed)
 VOB submitted for Orthovisc, Bilateral knee(s) BV pending

## 2023-06-10 NOTE — Patient Instructions (Signed)
 Iron Sucrose Injection What is this medication? IRON SUCROSE (EYE ern SOO krose) treats low levels of iron (iron deficiency anemia) in people with kidney disease. Iron is a mineral that plays an important role in making red blood cells, which carry oxygen from your lungs to the rest of your body. This medicine may be used for other purposes; ask your health care provider or pharmacist if you have questions. COMMON BRAND NAME(S): Venofer What should I tell my care team before I take this medication? They need to know if you have any of these conditions: Anemia not caused by low iron levels Heart disease High levels of iron in the blood Kidney disease Liver disease An unusual or allergic reaction to iron, other medications, foods, dyes, or preservatives Pregnant or trying to get pregnant Breastfeeding How should I use this medication? This medication is for infusion into a vein. It is given in a hospital or clinic setting. Talk to your care team about the use of this medication in children. While this medication may be prescribed for children as young as 2 years for selected conditions, precautions do apply. Overdosage: If you think you have taken too much of this medicine contact a poison control center or emergency room at once. NOTE: This medicine is only for you. Do not share this medicine with others. What if I miss a dose? Keep appointments for follow-up doses. It is important not to miss your dose. Call your care team if you are unable to keep an appointment. What may interact with this medication? Do not take this medication with any of the following: Deferoxamine Dimercaprol Other iron products This medication may also interact with the following: Chloramphenicol Deferasirox This list may not describe all possible interactions. Give your health care provider a list of all the medicines, herbs, non-prescription drugs, or dietary supplements you use. Also tell them if you smoke,  drink alcohol, or use illegal drugs. Some items may interact with your medicine. What should I watch for while using this medication? Visit your care team regularly. Tell your care team if your symptoms do not start to get better or if they get worse. You may need blood work done while you are taking this medication. You may need to follow a special diet. Talk to your care team. Foods that contain iron include: whole grains/cereals, dried fruits, beans, or peas, leafy green vegetables, and organ meats (liver, kidney). What side effects may I notice from receiving this medication? Side effects that you should report to your care team as soon as possible: Allergic reactions--skin rash, itching, hives, swelling of the face, lips, tongue, or throat Low blood pressure--dizziness, feeling faint or lightheaded, blurry vision Shortness of breath Side effects that usually do not require medical attention (report to your care team if they continue or are bothersome): Flushing Headache Joint pain Muscle pain Nausea Pain, redness, or irritation at injection site This list may not describe all possible side effects. Call your doctor for medical advice about side effects. You may report side effects to FDA at 1-800-FDA-1088. Where should I keep my medication? This medication is given in a hospital or clinic. It will not be stored at home. NOTE: This sheet is a summary. It may not cover all possible information. If you have questions about this medicine, talk to your doctor, pharmacist, or health care provider.  2024 Elsevier/Gold Standard (2022-10-30 00:00:00)

## 2023-06-17 NOTE — Telephone Encounter (Signed)
LMOM for patient to schedule Orthovisc injections

## 2023-06-17 NOTE — Telephone Encounter (Signed)
 Please call to schedule visco injections.  Approved for Orthovisc, Bilateral knee(s). [employer] Stable Deductible applies Once the deductible has been met $150 (Met $0), patient is responsible for a copay $40 Co-pay Prior authorization is not required

## 2023-08-13 LAB — LAB REPORT - SCANNED
A1c: 6.5
Calcium: 10
EGFR: 57
TSH: 3.79 (ref 0.41–5.90)

## 2023-08-23 ENCOUNTER — Other Ambulatory Visit: Payer: Self-pay | Admitting: Oncology

## 2023-08-23 DIAGNOSIS — D509 Iron deficiency anemia, unspecified: Secondary | ICD-10-CM

## 2023-08-23 NOTE — Progress Notes (Signed)
 [facility]  [address] [place],  [place]  [address] [phone]  Clinic Day:  08/24/2023  Referring physician: Paulina Fusi, MD   HISTORY OF PRESENT ILLNESS:  The patient is a 78 y.o. male with iron deficiency anemia.  He comes in today to reassess his iron and hemoglobin levels after recently receiving IV iron In December/January 2025.  The patient has noticed an improvement in how he is felt since his IV iron was given.  He continues to deny having any overt forms of blood loss since his last visit.   PHYSICAL EXAM:  Blood pressure 139/61, pulse (!) 52, temperature 98.9 F (37.2 C), temperature source Oral, resp. rate 14, height 5\' 8"  (1.727 m), weight 200 lb (90.7 kg), SpO2 99%. Wt Readings from Last 3 Encounters:  08/24/23 200 lb (90.7 kg)  06/10/23 203 lb (92.1 kg)  06/07/23 201 lb (91.2 kg)   Body mass index is 30.41 kg/m. Performance status (ECOG): 1 - Symptomatic but completely ambulatory Physical Exam Constitutional:      Appearance: Normal appearance. He is not ill-appearing.  HENT:     Mouth/Throat:     Mouth: Mucous membranes are moist.     Pharynx: Oropharynx is clear. No oropharyngeal exudate or posterior oropharyngeal erythema.  Cardiovascular:     Rate and Rhythm: Normal rate and regular rhythm.     Heart sounds: No murmur heard.    No friction rub. No gallop.  Pulmonary:     Effort: Pulmonary effort is normal. No respiratory distress.     Breath sounds: Normal breath sounds. No wheezing, rhonchi or rales.  Abdominal:     General: Bowel sounds are normal. There is no distension.     Palpations: Abdomen is soft. There is no mass.     Tenderness: There is no abdominal tenderness.  Musculoskeletal:        General: No swelling.     Right lower leg: No edema.     Left lower leg: No edema.  Lymphadenopathy:     Cervical: No cervical adenopathy.     Upper Body:     Right upper body: No supraclavicular or axillary  adenopathy.     Left upper body: No supraclavicular or axillary adenopathy.     Lower Body: No right inguinal adenopathy. No left inguinal adenopathy.  Skin:    General: Skin is warm.     Coloration: Skin is not jaundiced.     Findings: No lesion or rash.  Neurological:     General: No focal deficit present.     Mental Status: He is alert and oriented to person, place, and time. Mental status is at baseline.  Psychiatric:        Mood and Affect: Mood normal.        Behavior: Behavior normal.        Thought Content: Thought content normal.    LABS:      Latest Ref Rng & Units 08/24/2023    1:17 PM 05/26/2023    2:54 PM 05/24/2020    1:05 PM  CBC  WBC 4.0 - 10.5 K/uL 18.3  10.9  7.2   Hemoglobin 13.0 - 17.0 g/dL 91.4  78.2  95.6   Hematocrit 39.0 - 52.0 % 40.1  33.5  43.5   Platelets 150 - 400 K/uL 309  459  215       Latest Ref Rng & Units 08/24/2023    1:17 PM 08/12/2023   12:00  AM 05/26/2023    2:54 PM  CMP  Glucose 70 - 99 mg/dL 161   096   BUN 8 - 23 mg/dL 25   18   Creatinine 0.45 - 1.24 mg/dL 4.09   8.11   Sodium 914 - 145 mmol/L 135   139   Potassium 3.5 - 5.1 mmol/L 4.4   3.9   Chloride 98 - 111 mmol/L 101   102   CO2 22 - 32 mmol/L 21   23   Calcium 8.9 - 10.3 mg/dL 9.8  78.2     95.6   Total Protein 6.5 - 8.1 g/dL 6.4   7.3   Total Bilirubin 0.0 - 1.2 mg/dL 0.4   0.3   Alkaline Phos 38 - 126 U/L 71   65   AST 15 - 41 U/L 14   25   ALT 0 - 44 U/L 23   23      This result is from an external source.    Latest Reference Range & Units 05/26/23 14:55 08/24/23 13:16  Iron 45 - 182 ug/dL 23 (L) 91  UIBC ug/dL 213 086  TIBC 578 - 469 ug/dL 629 (H) 528  Saturation Ratios 17.9 - 39.5 % 5 (L) 23  Ferritin 24 - 336 ng/mL 22 (L) 113  (L): Data is abnormally low (H): Data is abnormally high  ASSESSMENT & PLAN:  Assessment/Plan:  An 78 y.o. male with iron deficiency anemia.  His hemoglobin is definitely better since recently receiving his IV iron.  His iron  parameters are also significantly better.  Per outside records, it appears this gentleman has not had any type of GI workup within the past 5 years.  Based upon this and the fact that he has been iron deficient, I will arrange for him to be seen by GI in the forthcoming months to undergo his necessary workup to ensure there is no occult GI tract pathology behind his iron deficiency anemia.  Otherwise, I will see this patient back in 6 months for repeat clinical assessment. The patient understands all the plans discussed today and is in agreement with them.     Kira Hartl Kirby Funk, MD

## 2023-08-24 ENCOUNTER — Inpatient Hospital Stay: Payer: Medicare PPO

## 2023-08-24 ENCOUNTER — Inpatient Hospital Stay: Payer: Medicare PPO | Attending: Oncology | Admitting: Oncology

## 2023-08-24 ENCOUNTER — Telehealth: Payer: Self-pay | Admitting: Oncology

## 2023-08-24 ENCOUNTER — Other Ambulatory Visit: Payer: Self-pay | Admitting: Oncology

## 2023-08-24 VITALS — BP 139/61 | HR 52 | Temp 98.9°F | Resp 14 | Ht 68.0 in | Wt 200.0 lb

## 2023-08-24 DIAGNOSIS — D508 Other iron deficiency anemias: Secondary | ICD-10-CM | POA: Diagnosis not present

## 2023-08-24 DIAGNOSIS — D509 Iron deficiency anemia, unspecified: Secondary | ICD-10-CM | POA: Diagnosis present

## 2023-08-24 LAB — IRON AND TIBC
Iron: 91 ug/dL (ref 45–182)
Saturation Ratios: 23 % (ref 17.9–39.5)
TIBC: 396 ug/dL (ref 250–450)
UIBC: 305 ug/dL

## 2023-08-24 LAB — CBC WITH DIFFERENTIAL (CANCER CENTER ONLY)
Abs Immature Granulocytes: 0.17 10*3/uL — ABNORMAL HIGH (ref 0.00–0.07)
Basophils Absolute: 0 10*3/uL (ref 0.0–0.1)
Basophils Relative: 0 %
Eosinophils Absolute: 0.2 10*3/uL (ref 0.0–0.5)
Eosinophils Relative: 1 %
HCT: 40.1 % (ref 39.0–52.0)
Hemoglobin: 12.9 g/dL — ABNORMAL LOW (ref 13.0–17.0)
Immature Granulocytes: 1 %
Lymphocytes Relative: 12 %
Lymphs Abs: 2.2 10*3/uL (ref 0.7–4.0)
MCH: 26.7 pg (ref 26.0–34.0)
MCHC: 32.2 g/dL (ref 30.0–36.0)
MCV: 82.9 fL (ref 80.0–100.0)
Monocytes Absolute: 2 10*3/uL — ABNORMAL HIGH (ref 0.1–1.0)
Monocytes Relative: 11 %
Neutro Abs: 13.8 10*3/uL — ABNORMAL HIGH (ref 1.7–7.7)
Neutrophils Relative %: 75 %
Platelet Count: 309 10*3/uL (ref 150–400)
RBC: 4.84 MIL/uL (ref 4.22–5.81)
RDW: 18.7 % — ABNORMAL HIGH (ref 11.5–15.5)
WBC Count: 18.3 10*3/uL — ABNORMAL HIGH (ref 4.0–10.5)
nRBC: 0 % (ref 0.0–0.2)
nRBC: 0 /100{WBCs}

## 2023-08-24 LAB — CMP (CANCER CENTER ONLY)
ALT: 23 U/L (ref 0–44)
AST: 14 U/L — ABNORMAL LOW (ref 15–41)
Albumin: 4 g/dL (ref 3.5–5.0)
Alkaline Phosphatase: 71 U/L (ref 38–126)
Anion gap: 12 (ref 5–15)
BUN: 25 mg/dL — ABNORMAL HIGH (ref 8–23)
CO2: 21 mmol/L — ABNORMAL LOW (ref 22–32)
Calcium: 9.8 mg/dL (ref 8.9–10.3)
Chloride: 101 mmol/L (ref 98–111)
Creatinine: 1.22 mg/dL (ref 0.61–1.24)
GFR, Estimated: 60 mL/min — ABNORMAL LOW (ref >60)
Glucose, Bld: 117 mg/dL — ABNORMAL HIGH (ref 70–99)
Potassium: 4.4 mmol/L (ref 3.5–5.1)
Sodium: 135 mmol/L (ref 135–145)
Total Bilirubin: 0.4 mg/dL (ref 0.0–1.2)
Total Protein: 6.4 g/dL — ABNORMAL LOW (ref 6.5–8.1)

## 2023-08-24 LAB — FERRITIN: Ferritin: 113 ng/mL (ref 24–336)

## 2023-08-24 NOTE — Telephone Encounter (Signed)
 08/24/23 Spoke with patient and confirmed next appt.

## 2023-08-25 ENCOUNTER — Telehealth: Payer: Self-pay

## 2023-08-25 NOTE — Telephone Encounter (Signed)
 Latest Reference Range & Units 08/24/23 13:16  Iron 45 - 182 ug/dL 91  UIBC ug/dL 841  TIBC 660 - 630 ug/dL 160  Saturation Ratios 17.9 - 39.5 % 23  Ferritin 24 - 336 ng/mL 113

## 2023-08-28 ENCOUNTER — Encounter: Payer: Self-pay | Admitting: Hematology

## 2023-09-28 ENCOUNTER — Ambulatory Visit: Payer: Medicare PPO | Attending: Internal Medicine | Admitting: Internal Medicine

## 2023-09-28 DIAGNOSIS — B353 Tinea pedis: Secondary | ICD-10-CM

## 2023-09-28 DIAGNOSIS — M170 Bilateral primary osteoarthritis of knee: Secondary | ICD-10-CM | POA: Diagnosis not present

## 2023-09-28 MED ORDER — HYALURONAN 30 MG/2ML IX SOSY
30.0000 mg | PREFILLED_SYRINGE | INTRA_ARTICULAR | Status: AC | PRN
  Administered 2023-09-28: 30 mg via INTRA_ARTICULAR

## 2023-09-28 MED ORDER — LIDOCAINE HCL 1 % IJ SOLN
1.5000 mL | INTRAMUSCULAR | Status: AC | PRN
  Administered 2023-09-28: 1.5 mL

## 2023-09-28 MED ORDER — LIDOCAINE HCL 1 % IJ SOLN
1.5000 mL | INTRAMUSCULAR | Status: AC | PRN
Start: 2023-09-28 — End: 2023-09-28
  Administered 2023-09-28: 1.5 mL

## 2023-09-28 NOTE — Progress Notes (Signed)
   Procedure Note  Patient: Bob Newman             Date of Birth: 08/10/1945           MRN: [id number]             Visit Date: 09/28/2023  Procedures: Visit Diagnoses:  1. Bilateral primary osteoarthritis of knee   2. Tinea pedis of both feet     Orthovisc #1 bilateral knees, B/B Large Joint Inj: bilateral knee on 09/28/2023 2:00 PM Indications: pain Details: 25 G 1.5 in needle, medial approach  Arthrogram: No  Medications (Right): 1.5 mL lidocaine  1 %; 30 mg Hyaluronan 30 MG/2ML Aspirate (Right): 0 mL Medications (Left): 1.5 mL lidocaine  1 %; 30 mg Hyaluronan 30 MG/2ML Aspirate (Left): 0 mL Outcome: tolerated well, no immediate complications Procedure, treatment alternatives, risks and benefits explained, specific risks discussed. Consent was given by the patient. Immediately prior to procedure a time out was called to verify the correct patient, procedure, equipment, support staff and site/side marked as required. Patient was prepped and draped in the usual sterile fashion.     Bilateral knee pain and stiffness with intermittent swelling. Some improvement after recent steroid injection in the hip with PCP office. Also notes bilateral foot rashes, appearing consistent with tinea pedis and onychomycosis but no widespread symptoms or involvement. Otherwise feeling well today. Visco injections #1/3 of series completely uneventfully.

## 2023-10-05 ENCOUNTER — Ambulatory Visit: Payer: Medicare PPO | Attending: Internal Medicine | Admitting: Internal Medicine

## 2023-10-05 DIAGNOSIS — M170 Bilateral primary osteoarthritis of knee: Secondary | ICD-10-CM | POA: Diagnosis not present

## 2023-10-05 MED ORDER — LIDOCAINE HCL 1 % IJ SOLN
1.5000 mL | INTRAMUSCULAR | Status: AC | PRN
  Administered 2023-10-05: 1.5 mL

## 2023-10-05 MED ORDER — HYALURONAN 30 MG/2ML IX SOSY
20.0000 mg | PREFILLED_SYRINGE | INTRA_ARTICULAR | Status: AC | PRN
  Administered 2023-10-05: 20 mg via INTRA_ARTICULAR

## 2023-10-05 MED ORDER — HYALURONAN 30 MG/2ML IX SOSY
30.0000 mg | PREFILLED_SYRINGE | INTRA_ARTICULAR | Status: AC | PRN
  Administered 2023-10-05: 30 mg via INTRA_ARTICULAR

## 2023-10-05 NOTE — Progress Notes (Signed)
   Procedure Note  Patient: Bob Newman             Date of Birth: [birth year]           MRN: [id number]             Visit Date: 10/05/2023  Procedures: Visit Diagnoses:  1. Bilateral primary osteoarthritis of knee     Orthovisc #2 Bilateral Knees B/B Large Joint Inj: bilateral knee on 10/05/2023 12:55 PM Indications: pain Details: 22 G 1.5 in needle, medial approach Medications (Right): 1.5 mL lidocaine  1 %; 20 mg Hyaluronan 30 MG/2ML Medications (Left): 1.5 mL lidocaine  1 %; 30 mg Hyaluronan 30 MG/2ML Outcome: tolerated well, no immediate complications  A portion of right knee Orthovisc injection volume estimate 1/3 was not injected due to syringe lock separation Procedure, treatment alternatives, risks and benefits explained, specific risks discussed. Consent was given by the patient. Immediately prior to procedure a time out was called to verify the correct patient, procedure, equipment, support staff and site/side marked as required. Patient was prepped and draped in the usual sterile fashion.     Reports some swelling in right knee transiently after injection last week. This is now resolved. Discussed new pain in lower abdomen, low back, urinary and fecal incontinence, and bilateral foot numbness starting after standing from filling a gas can. Burning pain with urination and saw family medicine for this yesterday starting antibiotic without relief so far.

## 2023-10-12 ENCOUNTER — Ambulatory Visit: Payer: Medicare PPO | Attending: Internal Medicine | Admitting: Internal Medicine

## 2023-10-12 DIAGNOSIS — M170 Bilateral primary osteoarthritis of knee: Secondary | ICD-10-CM

## 2023-10-12 MED ORDER — HYALURONAN 30 MG/2ML IX SOSY
30.0000 mg | PREFILLED_SYRINGE | INTRA_ARTICULAR | Status: AC | PRN
Start: 2023-10-12 — End: 2023-10-12
  Administered 2023-10-12: 30 mg via INTRA_ARTICULAR

## 2023-10-12 MED ORDER — HYALURONAN 30 MG/2ML IX SOSY
30.0000 mg | PREFILLED_SYRINGE | INTRA_ARTICULAR | Status: AC | PRN
  Administered 2023-10-12: 30 mg via INTRA_ARTICULAR

## 2023-10-12 MED ORDER — LIDOCAINE HCL 1 % IJ SOLN
1.5000 mL | INTRAMUSCULAR | Status: AC | PRN
Start: 2023-10-12 — End: 2023-10-12
  Administered 2023-10-12: 1.5 mL

## 2023-10-12 MED ORDER — LIDOCAINE HCL 1 % IJ SOLN
1.5000 mL | INTRAMUSCULAR | Status: AC | PRN
  Administered 2023-10-12: 1.5 mL

## 2023-10-12 NOTE — Progress Notes (Signed)
   Procedure Note  Patient: Bob Newman             Date of Birth: Nov 24, 1945           MRN: [id number]             Visit Date: 10/12/2023  Procedures: Visit Diagnoses:  1. Bilateral primary osteoarthritis of knee     Orthovisc #3 bilateral knees, B/B Large Joint Inj: bilateral knee on 10/12/2023 2:55 PM Indications: pain Details: 25 G 1.5 in needle, medial approach  Arthrogram: No  Medications (Right): 1.5 mL lidocaine  1 %; 30 mg Hyaluronan 30 MG/2ML Aspirate (Right): 0 mL Medications (Left): 1.5 mL lidocaine  1 %; 30 mg Hyaluronan 30 MG/2ML Aspirate (Left): 0 mL Outcome: tolerated well, no immediate complications  Small bruising area on left medial knee from previous injection site. Small flat erythematous patch about 1cm on medial right knee at injection site. Procedure, treatment alternatives, risks and benefits explained, specific risks discussed. Consent was given by the patient. Patient was prepped and draped in the usual sterile fashion.

## 2023-11-22 ENCOUNTER — Ambulatory Visit: Payer: Medicare PPO | Admitting: Allergy and Immunology

## 2023-11-22 VITALS — BP 148/72 | HR 68 | Resp 16 | Ht 67.0 in | Wt 208.0 lb

## 2023-11-22 DIAGNOSIS — D509 Iron deficiency anemia, unspecified: Secondary | ICD-10-CM

## 2023-11-22 DIAGNOSIS — J4550 Severe persistent asthma, uncomplicated: Secondary | ICD-10-CM

## 2023-11-22 DIAGNOSIS — K219 Gastro-esophageal reflux disease without esophagitis: Secondary | ICD-10-CM

## 2023-11-22 DIAGNOSIS — J3089 Other allergic rhinitis: Secondary | ICD-10-CM

## 2023-11-22 NOTE — Patient Instructions (Addendum)
  1. Continue reflux therapy:   A.  Omeprazole  40 mg - 1 tablet in AM  B.  Famotidine  40 mg - 1 tablet in PM  2. Continue anti-inflammatory therapy for chest:    A. QVAR  80 - 2 inhalations 1-2 times per day   B. montelukast  10 mg daily  3. Continue anti-inflammatory therapy for nose:   A. Afrin - 1 spray in single nostril in evening. Alternate nostrils  B. flonase  + astelin  - 1 spray each in both nostrils in evening  D. flonase  + astelin  - 1 spray each in both nostrils in morning  4. If Needed:   A. Albuterol  + QVAR  - 2 inhalations TOGETHER every 4-6 hours if needed  B. Zyrtec one tablet one time per day   C. nasal saline multiple times a day   D. Mucinex DM 2 tablets twice a day   5. Can visit with GI about iron  loss  6. Return in 6 months or earlier if problem  7. Influenza = Tamiflu. Covid = Paxlovid

## 2023-11-22 NOTE — Progress Notes (Unsigned)
 Bob Newman - Bob Newman - Bob Newman - Bob Newman - Bob Newman   Follow-up Note  Referring Provider: Adrian Hopper, MD Primary Provider: Adrian Hopper, MD Date of Office Visit: 11/22/2023  Subjective:   Bob Newman (DOB: 05-24-1946) is a 78 y.o. male who [facility] in re-evaluation of the following:  HPI: Bob Newman returns to this clinic in evaluation of asthma, allergic rhinitis, LPR, and iron  deficiency anemia.  I last saw him in this clinic 20 May 2023.  He has done very well from an airway standpoint and rarely needs to use the short acting bronchodilator and can exert himself to the extent that musculoskeletal issues allow him to do so without any difficulty and has not required a systemic steroid to treat any issues with asthma.  He is currently using Qvar  on a regular basis just 1 time per day.  He believes that his nose is doing pretty well on his current plan which includes a nasal antihistamine, nasal steroid, and a topical decongestant.  He believes that his reflux is under very good control.  When I last saw him in this clinic we documented his rather significant iron  deficiency anemia with a hemoglobin of 10 anyone there went infusions of iron  with oncology and his hemoglobin is up to 12.1.  However, a recent check of his iron  show that it did drop once again.  He has never been evaluated by GI for this issue even though we recommend that he do so during his last visit.  Allergies as of 11/22/2023       Reactions   Latex Shortness Of Breath   Levofloxacin Hives, Other (See Comments)   Hives  Was taking Levaquin, Miralax and Spiriva  at same time.  MDs unsure of which caused reaction   Spiriva  Handihaler [tiotropium Bromide  Monohydrate] Hives   Polyethylene Glycol Hives        Medication List    albuterol  108 (90 Base) MCG/ACT inhaler Commonly known as: VENTOLIN  HFA 2 inhalations every 4-6 hours as needed    amLODipine 5 MG tablet Commonly known as: NORVASC 5 mg daily.   azelastine  0.1 % nasal spray Commonly known as: ASTELIN  Use one spray in each nostril twice daily as directed.   candesartan 16 MG tablet Commonly known as: ATACAND Take 16 mg by mouth daily.   Crestor 20 MG tablet Generic drug: rosuvastatin Take 20 mg by mouth daily.   cyanocobalamin  500 MCG tablet Commonly known as: VITAMIN B12 Take by mouth.   dronedarone 400 MG tablet Commonly known as: MULTAQ Take 400 mg by mouth 2 (two) times daily with a meal.   Eliquis 5 MG Tabs tablet Generic drug: apixaban   famotidine  40 MG tablet Commonly known as: PEPCID  Take 1 tablet (40 mg total) by mouth at bedtime.   ferrous sulfate 325 (65 FE) MG tablet Take 325 mg by mouth daily with breakfast.   fluticasone  50 MCG/ACT nasal spray Commonly known as: FLONASE  One spray in both nostrils in the morning and the evening   furosemide 40 MG tablet Commonly known as: LASIX Take by mouth.   metoprolol succinate 50 MG 24 hr tablet Commonly known as: TOPROL-XL Take by mouth. 50 mg in the am and 25 mg in the pm   montelukast  10 MG tablet Commonly known as: SINGULAIR  TAKE ONE TABLET ONCE DAILY   nitroGLYCERIN 0.4 MG SL tablet Commonly known as: NITROSTAT   omeprazole  40 MG capsule Commonly known  as: PRILOSEC TAKE ONE CAPSULE (40 MG TOTAL) BY MOUTH EVERY MORNING AS DIRECTED.   Qvar  RediHaler 80 MCG/ACT inhaler Generic drug: beclomethasone Inhale into the lungs.        Past Medical History:  Diagnosis Date   Allergic rhinoconjunctivitis    Asthma    Atrial fibrillation (HCC)    GERD (gastroesophageal reflux disease)    Bob blood pressure     Past Surgical History:  Procedure Laterality Date   CHOLECYSTECTOMY     THROMBOENDARTERECTOMY Right 09/22/2016   Joleen Navy, [facility] at [facility]   TONSILLECTOMY      Review of systems negative except as noted in HPI / PMHx or noted below:  Review of  Systems  Constitutional: Negative.   HENT: Negative.    Eyes: Negative.   Respiratory: Negative.    Cardiovascular: Negative.   Gastrointestinal: Negative.   Genitourinary: Negative.   Musculoskeletal: Negative.   Skin: Negative.   Neurological: Negative.   Endo/Heme/Allergies: Negative.   Psychiatric/Behavioral: Negative.       Objective:   Vitals:   11/22/23 1350  BP: (!) 148/72  Pulse: 68  Resp: 16  SpO2: 93%   Height: 5' 7 (170.2 cm)  Weight: 208 lb (94.3 kg)   Physical Exam Constitutional:      Appearance: He is not diaphoretic.  HENT:     Head: Normocephalic.     Right Ear: Tympanic membrane, ear canal and external ear normal.     Left Ear: Tympanic membrane, ear canal and external ear normal.     Nose: Nose normal. No mucosal edema or rhinorrhea.     Mouth/Throat:     Pharynx: Uvula midline. No oropharyngeal exudate.   Eyes:     Conjunctiva/sclera: Conjunctivae normal.   Neck:     Thyroid: No thyromegaly.     Trachea: Trachea normal. No tracheal tenderness or tracheal deviation.   Cardiovascular:     Rate and Rhythm: Normal rate and regular rhythm.     Heart sounds: Normal heart sounds, S1 normal and S2 normal. No murmur heard. Pulmonary:     Effort: No respiratory distress.     Breath sounds: Normal breath sounds. No stridor. No wheezing or rales.  Lymphadenopathy:     Head:     Right side of head: No tonsillar adenopathy.     Left side of head: No tonsillar adenopathy.     Cervical: No cervical adenopathy.   Skin:    Findings: No erythema or rash.     Nails: There is no clubbing.   Neurological:     Mental Status: He is alert.     Diagnostics: Spirometry was performed and demonstrated an FEV1 of 2.17 at 84 % of predicted.  Assessment and Plan:   1. Asthma, severe persistent, well-controlled   2. Other allergic rhinitis   3. LPRD (laryngopharyngeal reflux disease)   4. Iron  deficiency anemia, unspecified iron  deficiency anemia type     1. Continue reflux therapy:   A.  Omeprazole  40 mg - 1 tablet in AM  B.  Famotidine  40 mg - 1 tablet in PM  2. Continue anti-inflammatory therapy for chest:    A. QVAR  80 - 2 inhalations 1-2 times per day   B. montelukast  10 mg daily  3. Continue anti-inflammatory therapy for nose:   A. Afrin - 1 spray in single nostril in evening. Alternate nostrils  B. flonase  + astelin  - 1 spray each in both nostrils in evening  D. flonase  +  astelin  - 1 spray each in both nostrils in morning  4. If Needed:   A. Albuterol  + QVAR  - 2 inhalations TOGETHER every 4-6 hours if needed  B. Zyrtec one tablet one time per day   C. nasal saline multiple times a day   D. Mucinex DM 2 tablets twice a day   5. Can visit with GI about iron  loss  6. Return in 6 months or earlier if problem  7. Influenza = Tamiflu. Covid = Paxlovid  Bob Newman is doing very well regarding his airway issue and his reflux appears to be under good control and he will remain on anti-inflammatory agents for his airway as noted above then a combination of proton pump inhibitor and H2 receptor blocker.  He is having iron  loss and I did encourage him to follow-up with GI regarding this issue.  It is quite possible he just cannot absorb iron  but there may be other reasons why his iron  keeps dropping.  If he does well I will see him back in his clinic in 6 months or earlier if there is a problem.  Schuyler Custard, MD Allergy / Immunology [facility]

## 2023-11-23 ENCOUNTER — Encounter: Payer: Self-pay | Admitting: Allergy and Immunology

## 2023-12-23 ENCOUNTER — Encounter: Payer: Self-pay | Admitting: Allergy and Immunology

## 2023-12-23 ENCOUNTER — Ambulatory Visit: Admitting: Allergy and Immunology

## 2023-12-23 VITALS — BP 108/58 | HR 70 | Resp 18

## 2023-12-23 DIAGNOSIS — K219 Gastro-esophageal reflux disease without esophagitis: Secondary | ICD-10-CM

## 2023-12-23 DIAGNOSIS — J3089 Other allergic rhinitis: Secondary | ICD-10-CM | POA: Diagnosis not present

## 2023-12-23 DIAGNOSIS — B9789 Other viral agents as the cause of diseases classified elsewhere: Secondary | ICD-10-CM

## 2023-12-23 DIAGNOSIS — J4550 Severe persistent asthma, uncomplicated: Secondary | ICD-10-CM | POA: Diagnosis not present

## 2023-12-23 DIAGNOSIS — J988 Other specified respiratory disorders: Secondary | ICD-10-CM

## 2023-12-23 MED ORDER — METHYLPREDNISOLONE ACETATE 80 MG/ML IJ SUSP
40.0000 mg | Freq: Once | INTRAMUSCULAR | Status: AC
  Administered 2023-12-23: 40 mg via INTRAMUSCULAR

## 2023-12-23 MED ORDER — MUPIROCIN 2 % EX OINT: TOPICAL_OINTMENT | CUTANEOUS | 0 refills | Status: DC

## 2023-12-23 NOTE — Patient Instructions (Addendum)
  1. Continue reflux therapy:   A.  Omeprazole  40 mg - 1 tablet in AM  B.  Famotidine  40 mg - 1 tablet in PM  2. Continue anti-inflammatory therapy for chest:    A. QVAR  80 - 2 inhalations 1-2 times per day   B. montelukast  10 mg daily  3. Continue anti-inflammatory therapy for nose:   A. Afrin - 1 spray in single nostril in evening. Alternate nostrils  B. flonase  + astelin  - 1 spray each in both nostrils in evening  D. flonase  + astelin  - 1 spray each in both nostrils in morning  4. If Needed:   A. Albuterol  + QVAR  - 2 inhalations TOGETHER every 4-6 hours if needed  B. Zyrtec one tablet one time per day   C. nasal saline multiple times a day   D. Mucinex DM 2 tablets twice a day   5. For this recent event:   A. No afrin, flonase , astelin  next 10 days  B. Nasal saline, then mupirocin  inside nasal airway 3 times a day for 10 days  C. Depomedrol 40 IM delivered in clinic today  6. Return in 6 months or earlier if problem  7. Influenza = Tamiflu. Covid = Paxlovid

## 2023-12-23 NOTE — Progress Notes (Signed)
 [place] [facility] [place] [facility] [place] - [name] [facility] [place]   Follow-up Note  Referring Provider: Keren Vicenta BRAVO, MD Primary Provider: Keren Vicenta BRAVO, MD Date of Office Visit: 12/23/2023  Subjective:   Bob Newman (DOB: 04-Nov-1945) is a 78 y.o. male who [facility]23 in re-evaluation of the following:  HPI: Jamonta presents to this clinic in evaluation of an acute illness.  He is followed in this clinic for asthma, allergic rhinitis, LPR.  I last saw him in his clinic 22 November 2023.  He developed an illness 6 days ago where he felt awful with diarrhea and chills and sweats and his head swimmy and sneezing and blowing and coughing with some posttussive emesis.  This pretty much progressed throughout the week until today when he actually feels a little bit better.  He does not have any diarrhea today.  His cough and shortness of breath have decreased significantly.  But his nose is really sore inside.  He never had any anosmia.  He never tested himself for COVID or flu.  Allergies as of 12/23/2023       Reactions   Latex Shortness Of Breath   Levofloxacin Hives, Other (See Comments)   Hives  Was taking Levaquin, Miralax and Spiriva  at same time.  MDs unsure of which caused reaction   Spiriva  Handihaler [tiotropium Bromide  Monohydrate] Hives   Polyethylene Glycol Hives        Medication List    albuterol  108 (90 Base) MCG/ACT inhaler Commonly known as: VENTOLIN  HFA 2 inhalations every 4-6 hours as needed   amLODipine 5 MG tablet Commonly known as: NORVASC 5 mg daily.   azelastine  0.1 % nasal spray Commonly known as: ASTELIN  Use one spray in each nostril twice daily as directed.   candesartan 16 MG tablet Commonly known as: ATACAND Take 16 mg by mouth daily.   Crestor 20 MG tablet Generic drug: rosuvastatin Take 20 mg by mouth daily.   cyanocobalamin  500 MCG tablet Commonly known as: VITAMIN B12 Take by mouth.    dronedarone 400 MG tablet Commonly known as: MULTAQ Take 400 mg by mouth 2 (two) times daily with a meal.   Eliquis 5 MG Tabs tablet Generic drug: apixaban   famotidine  40 MG tablet Commonly known as: PEPCID  Take 1 tablet (40 mg total) by mouth at bedtime.   ferrous sulfate 325 (65 FE) MG tablet Take 325 mg by mouth daily with breakfast.   fluticasone  50 MCG/ACT nasal spray Commonly known as: FLONASE  One spray in both nostrils in the morning and the evening   furosemide 40 MG tablet Commonly known as: LASIX Take by mouth.   metoprolol succinate 50 MG 24 hr tablet Commonly known as: TOPROL-XL Take by mouth. 50 mg in the am and 25 mg in the pm   montelukast  10 MG tablet Commonly known as: SINGULAIR  TAKE ONE TABLET ONCE DAILY   mupirocin  ointment 2 % Commonly known as: BACTROBAN  After using nasal saline, apply inside both nostrils three times daily for ten days. Started by: Quantia Grullon J Titus Drone   nitroGLYCERIN 0.4 MG SL tablet Commonly known as: NITROSTAT   omeprazole  40 MG capsule Commonly known as: PRILOSEC TAKE ONE CAPSULE (40 MG TOTAL) BY MOUTH EVERY MORNING AS DIRECTED.   Qvar  RediHaler 80 MCG/ACT inhaler Generic drug: beclomethasone Inhale into the lungs.     Past Medical History:  Diagnosis Date   Allergic rhinoconjunctivitis    Asthma  Atrial fibrillation (HCC)    GERD (gastroesophageal reflux disease)    High blood pressure     Past Surgical History:  Procedure Laterality Date   CHOLECYSTECTOMY     THROMBOENDARTERECTOMY Right 09/22/2016   Pierce Maryelizabeth Finn, MD at [facility]   TONSILLECTOMY      Review of systems negative except as noted in HPI / PMHx or noted below:  Review of Systems  Constitutional: Negative.   HENT: Negative.    Eyes: Negative.   Respiratory: Negative.    Cardiovascular: Negative.   Gastrointestinal: Negative.   Genitourinary: Negative.   Musculoskeletal: Negative.   Skin: Negative.   Neurological: Negative.    Endo/Heme/Allergies: Negative.   Psychiatric/Behavioral: Negative.       Objective:   Vitals:   12/23/23 1117  BP: (!) 108/58  Pulse: 70  Resp: 18  SpO2: 96%          Physical Exam Constitutional:      Appearance: He is not diaphoretic.  HENT:     Head: Normocephalic.     Right Ear: Tympanic membrane, ear canal and external ear normal.     Left Ear: Tympanic membrane, ear canal and external ear normal.     Nose: Nose normal. No mucosal edema (Extremely erythematous nasal mucosa) or rhinorrhea.     Mouth/Throat:     Pharynx: Uvula midline. No oropharyngeal exudate.  Eyes:     Conjunctiva/sclera: Conjunctivae normal.  Neck:     Thyroid: No thyromegaly.     Trachea: Trachea normal. No tracheal tenderness or tracheal deviation.  Cardiovascular:     Rate and Rhythm: Normal rate and regular rhythm.     Heart sounds: Normal heart sounds, S1 normal and S2 normal. No murmur heard. Pulmonary:     Effort: No respiratory distress.     Breath sounds: Normal breath sounds. No stridor. No wheezing or rales.  Lymphadenopathy:     Head:     Right side of head: No tonsillar adenopathy.     Left side of head: No tonsillar adenopathy.     Cervical: No cervical adenopathy.  Skin:    Findings: No erythema or rash.     Nails: There is no clubbing.  Neurological:     Mental Status: He is alert.     Diagnostics: none  Assessment and Plan:   1. Viral respiratory illness   2. Asthma, severe persistent, well-controlled   3. Other allergic rhinitis   4. LPRD (laryngopharyngeal reflux disease)     1. Continue reflux therapy:   A.  Omeprazole  40 mg - 1 tablet in AM  B.  Famotidine  40 mg - 1 tablet in PM  2. Continue anti-inflammatory therapy for chest:    A. QVAR  80 - 2 inhalations 1-2 times per day   B. montelukast  10 mg daily  3. Continue anti-inflammatory therapy for nose:   A. Afrin - 1 spray in single nostril in evening. Alternate nostrils  B. flonase  + astelin  - 1  spray each in both nostrils in evening  D. flonase  + astelin  - 1 spray each in both nostrils in morning  4. If Needed:   A. Albuterol  + QVAR  - 2 inhalations TOGETHER every 4-6 hours if needed  B. Zyrtec one tablet one time per day   C. nasal saline multiple times a day   D. Mucinex DM 2 tablets twice a day   5. For this recent event:   A. No afrin, flonase , astelin  next 10 days  B.  Nasal saline, then mupirocin  inside nasal airway 3 times a day for 10 days  C. Depomedrol 40 IM delivered in clinic today  6. Return in 6 months or earlier if problem  7. Influenza = Tamiflu. Covid = Paxlovid  Jamol just went through a very significant viral illness associated with systemic and constitutional symptoms that has left him with a extremely irritated nasal mucosa and some inflammation of his airway.  Fortunately, his diarrhea has resolved and his constitutional symptoms have resolved.  I am going to treat him with the therapy noted above for this recent event and assuming he does well he will continue on treatment directed against respiratory tract inflammation and reflux we will see him back in his clinic in 6 months or earlier if there is a problem.  Camellia Denis, MD Allergy / Immunology [facility]

## 2023-12-27 ENCOUNTER — Other Ambulatory Visit: Payer: Self-pay | Admitting: *Deleted

## 2023-12-27 ENCOUNTER — Telehealth: Payer: Self-pay | Admitting: *Deleted

## 2023-12-27 ENCOUNTER — Encounter: Payer: Self-pay | Admitting: Allergy and Immunology

## 2023-12-27 MED ORDER — AZITHROMYCIN 500 MG PO TABS: ORAL_TABLET | ORAL | 0 refills | Status: DC

## 2023-12-27 NOTE — Telephone Encounter (Signed)
 Miquel informed and rx sent.

## 2023-12-27 NOTE — Telephone Encounter (Signed)
 Bob Newman states that he is not doing any better. He is still very congested and is having trouble sleeping at night because he cannot breathe through his nose. He is requesting an antibiotic. Please advise.

## 2024-01-03 DIAGNOSIS — R079 Chest pain, unspecified: Secondary | ICD-10-CM | POA: Diagnosis not present

## 2024-01-03 DIAGNOSIS — R0602 Shortness of breath: Secondary | ICD-10-CM | POA: Diagnosis not present

## 2024-01-14 ENCOUNTER — Encounter: Payer: Self-pay | Admitting: Internal Medicine

## 2024-02-23 NOTE — Progress Notes (Unsigned)
 [facility]  [address] [place],  [place]  [address] [phone]  Clinic Day:  02/23/2024  Referring physician: Keren Vicenta BRAVO, MD   HISTORY OF PRESENT ILLNESS:  The patient is a 78 y.o. male with iron  deficiency anemia.  He comes in today to reassess his iron  and hemoglobin levels after recently receiving IV iron  In December/January 2025.  The patient has noticed an improvement in how he is felt since his IV iron  was given.  He continues to deny having any overt forms of blood loss since his last visit.   PHYSICAL EXAM:  There were no vitals taken for this visit. Wt Readings from Last 3 Encounters:  11/22/23 208 lb (94.3 kg)  08/24/23 200 lb (90.7 kg)  06/10/23 203 lb (92.1 kg)   There is no height or weight on file to calculate BMI. Performance status (ECOG): 1 - Symptomatic but completely ambulatory Physical Exam Constitutional:      Appearance: Normal appearance. He is not ill-appearing.  HENT:     Mouth/Throat:     Mouth: Mucous membranes are moist.     Pharynx: Oropharynx is clear. No oropharyngeal exudate or posterior oropharyngeal erythema.  Cardiovascular:     Rate and Rhythm: Normal rate and regular rhythm.     Heart sounds: No murmur heard.    No friction rub. No gallop.  Pulmonary:     Effort: Pulmonary effort is normal. No respiratory distress.     Breath sounds: Normal breath sounds. No wheezing, rhonchi or rales.  Abdominal:     General: Bowel sounds are normal. There is no distension.     Palpations: Abdomen is soft. There is no mass.     Tenderness: There is no abdominal tenderness.  Musculoskeletal:        General: No swelling.     Right lower leg: No edema.     Left lower leg: No edema.  Lymphadenopathy:     Cervical: No cervical adenopathy.     Upper Body:     Right upper body: No supraclavicular or axillary adenopathy.     Left upper body: No supraclavicular or axillary adenopathy.     Lower Body: No  right inguinal adenopathy. No left inguinal adenopathy.  Skin:    General: Skin is warm.     Coloration: Skin is not jaundiced.     Findings: No lesion or rash.  Neurological:     General: No focal deficit present.     Mental Status: He is alert and oriented to person, place, and time. Mental status is at baseline.  Psychiatric:        Mood and Affect: Mood normal.        Behavior: Behavior normal.        Thought Content: Thought content normal.    LABS:      Latest Ref Rng & Units 08/24/2023    1:17 PM 05/26/2023    2:54 PM 05/24/2020    1:05 PM  CBC  WBC 4.0 - 10.5 K/uL 18.3  10.9  7.2   Hemoglobin 13.0 - 17.0 g/dL 87.0  89.5  85.7   Hematocrit 39.0 - 52.0 % 40.1  33.5  43.5   Platelets 150 - 400 K/uL 309  459  215       Latest Ref Rng & Units 08/24/2023    1:17 PM 08/12/2023   12:00 AM 05/26/2023    2:54 PM  CMP  Glucose 70 - 99 mg/dL 882  103   BUN 8 - 23 mg/dL 25   18   Creatinine 9.38 - 1.24 mg/dL 8.77   8.52   Sodium 864 - 145 mmol/L 135   139   Potassium 3.5 - 5.1 mmol/L 4.4   3.9   Chloride 98 - 111 mmol/L 101   102   CO2 22 - 32 mmol/L 21   23   Calcium 8.9 - 10.3 mg/dL 9.8  89.9     89.8   Total Protein 6.5 - 8.1 g/dL 6.4   7.3   Total Bilirubin 0.0 - 1.2 mg/dL 0.4   0.3   Alkaline Phos 38 - 126 U/L 71   65   AST 15 - 41 U/L 14   25   ALT 0 - 44 U/L 23   23      This result is from an external source.    Latest Reference Range & Units 05/26/23 14:55 08/24/23 13:16  Iron  45 - 182 ug/dL 23 (L) 91  UIBC ug/dL 542 694  TIBC 749 - 549 ug/dL 519 (H) 603  Saturation Ratios 17.9 - 39.5 % 5 (L) 23  Ferritin 24 - 336 ng/mL 22 (L) 113  (L): Data is abnormally low (H): Data is abnormally high  ASSESSMENT & PLAN:  Assessment/Plan:  An 78 y.o. male with iron  deficiency anemia.  His hemoglobin is definitely better since recently receiving his IV iron .  His iron  parameters are also significantly better.  Per outside records, it appears this gentleman has not had any  type of GI workup within the past 5 years.  Based upon this and the fact that he has been iron  deficient, I will arrange for him to be seen by GI in the forthcoming months to undergo his necessary workup to ensure there is no occult GI tract pathology behind his iron  deficiency anemia.  Otherwise, I will see this patient back in 6 months for repeat clinical assessment. The patient understands all the plans discussed today and is in agreement with them.     Glynda Soliday DELENA Kerns, MD

## 2024-02-24 ENCOUNTER — Inpatient Hospital Stay

## 2024-02-24 ENCOUNTER — Inpatient Hospital Stay: Attending: Oncology | Admitting: Oncology

## 2024-02-24 ENCOUNTER — Other Ambulatory Visit: Payer: Self-pay | Admitting: Oncology

## 2024-02-24 ENCOUNTER — Telehealth: Payer: Self-pay | Admitting: Oncology

## 2024-02-24 VITALS — BP 128/66 | HR 61 | Temp 98.6°F | Resp 16 | Ht 67.0 in | Wt 201.9 lb

## 2024-02-24 DIAGNOSIS — D508 Other iron deficiency anemias: Secondary | ICD-10-CM

## 2024-02-24 DIAGNOSIS — D509 Iron deficiency anemia, unspecified: Secondary | ICD-10-CM | POA: Diagnosis present

## 2024-02-24 LAB — CBC WITH DIFFERENTIAL (CANCER CENTER ONLY)
Abs Immature Granulocytes: 0.05 K/uL (ref 0.00–0.07)
Basophils Absolute: 0.1 K/uL (ref 0.0–0.1)
Basophils Relative: 1 %
Eosinophils Absolute: 0.2 K/uL (ref 0.0–0.5)
Eosinophils Relative: 2 %
HCT: 34.3 % — ABNORMAL LOW (ref 39.0–52.0)
Hemoglobin: 10.8 g/dL — ABNORMAL LOW (ref 13.0–17.0)
Immature Granulocytes: 1 %
Lymphocytes Relative: 20 %
Lymphs Abs: 2.1 K/uL (ref 0.7–4.0)
MCH: 27.8 pg (ref 26.0–34.0)
MCHC: 31.5 g/dL (ref 30.0–36.0)
MCV: 88.4 fL (ref 80.0–100.0)
Monocytes Absolute: 1.1 K/uL — ABNORMAL HIGH (ref 0.1–1.0)
Monocytes Relative: 11 %
Neutro Abs: 6.7 K/uL (ref 1.7–7.7)
Neutrophils Relative %: 65 %
Platelet Count: 310 K/uL (ref 150–400)
RBC: 3.88 MIL/uL — ABNORMAL LOW (ref 4.22–5.81)
RDW: 14.7 % (ref 11.5–15.5)
WBC Count: 10.1 K/uL (ref 4.0–10.5)
nRBC: 0 % (ref 0.0–0.2)

## 2024-02-24 LAB — CMP (CANCER CENTER ONLY)
ALT: 15 U/L (ref 0–44)
AST: 17 U/L (ref 15–41)
Albumin: 3.8 g/dL (ref 3.5–5.0)
Alkaline Phosphatase: 49 U/L (ref 38–126)
Anion gap: 14 (ref 5–15)
BUN: 16 mg/dL (ref 8–23)
CO2: 21 mmol/L — ABNORMAL LOW (ref 22–32)
Calcium: 9.8 mg/dL (ref 8.9–10.3)
Chloride: 101 mmol/L (ref 98–111)
Creatinine: 1.5 mg/dL — ABNORMAL HIGH (ref 0.61–1.24)
GFR, Estimated: 47 mL/min — ABNORMAL LOW (ref >60)
Glucose, Bld: 103 mg/dL — ABNORMAL HIGH (ref 70–99)
Potassium: 4.2 mmol/L (ref 3.5–5.1)
Sodium: 136 mmol/L (ref 135–145)
Total Bilirubin: 0.5 mg/dL (ref 0.0–1.2)
Total Protein: 6.8 g/dL (ref 6.5–8.1)

## 2024-02-24 LAB — IRON AND TIBC
Iron: 35 ug/dL — ABNORMAL LOW (ref 45–182)
Saturation Ratios: 8 % — ABNORMAL LOW (ref 17.9–39.5)
TIBC: 434 ug/dL (ref 250–450)
UIBC: 400 ug/dL

## 2024-02-24 LAB — FERRITIN: Ferritin: 19 ng/mL — ABNORMAL LOW (ref 24–336)

## 2024-02-24 NOTE — Telephone Encounter (Signed)
 Patient has been scheduled for follow-up visit per 02/24/24 LOS.  Pt given an appt calendar with date and time.

## 2024-02-25 ENCOUNTER — Encounter: Payer: Self-pay | Admitting: Oncology

## 2024-02-25 ENCOUNTER — Other Ambulatory Visit: Payer: Self-pay

## 2024-02-25 ENCOUNTER — Telehealth: Payer: Self-pay

## 2024-02-25 DIAGNOSIS — D509 Iron deficiency anemia, unspecified: Secondary | ICD-10-CM

## 2024-02-25 NOTE — Telephone Encounter (Signed)
 Dr Ezzard: Let pt know he is iron  deficient; he needs to be set up for IV iron  ASAP. Have him see me back in 3 months (Dec 2025) instead of 4.

## 2024-02-28 ENCOUNTER — Inpatient Hospital Stay

## 2024-02-28 VITALS — BP 113/51 | HR 59 | Temp 98.3°F | Resp 16

## 2024-02-28 DIAGNOSIS — D509 Iron deficiency anemia, unspecified: Secondary | ICD-10-CM

## 2024-02-28 MED ORDER — IRON SUCROSE 20 MG/ML IV SOLN
200.0000 mg | Freq: Once | INTRAVENOUS | Status: AC
  Administered 2024-02-28: 200 mg via INTRAVENOUS
  Filled 2024-02-28: qty 10

## 2024-02-28 NOTE — Patient Instructions (Signed)

## 2024-03-01 ENCOUNTER — Inpatient Hospital Stay

## 2024-03-01 VITALS — BP 121/59 | HR 66 | Temp 98.5°F | Resp 16

## 2024-03-01 DIAGNOSIS — D509 Iron deficiency anemia, unspecified: Secondary | ICD-10-CM | POA: Diagnosis not present

## 2024-03-01 MED ORDER — IRON SUCROSE 20 MG/ML IV SOLN
200.0000 mg | Freq: Once | INTRAVENOUS | Status: AC
  Administered 2024-03-01: 200 mg via INTRAVENOUS
  Filled 2024-03-01: qty 10

## 2024-03-01 MED ORDER — SODIUM CHLORIDE 0.9 % IV SOLN: INTRAVENOUS | Status: DC

## 2024-03-01 NOTE — Patient Instructions (Signed)

## 2024-03-03 ENCOUNTER — Inpatient Hospital Stay

## 2024-03-03 VITALS — BP 123/48 | HR 61 | Resp 16

## 2024-03-03 DIAGNOSIS — D509 Iron deficiency anemia, unspecified: Secondary | ICD-10-CM

## 2024-03-03 MED ORDER — IRON SUCROSE 20 MG/ML IV SOLN
200.0000 mg | Freq: Once | INTRAVENOUS | Status: AC
  Administered 2024-03-03: 200 mg via INTRAVENOUS
  Filled 2024-03-03: qty 10

## 2024-03-03 NOTE — Patient Instructions (Signed)

## 2024-03-07 ENCOUNTER — Inpatient Hospital Stay

## 2024-03-08 ENCOUNTER — Inpatient Hospital Stay: Attending: Oncology

## 2024-03-08 VITALS — BP 121/48 | HR 63 | Temp 97.7°F | Resp 16

## 2024-03-08 DIAGNOSIS — D509 Iron deficiency anemia, unspecified: Secondary | ICD-10-CM | POA: Insufficient documentation

## 2024-03-08 MED ORDER — IRON SUCROSE 20 MG/ML IV SOLN
200.0000 mg | Freq: Once | INTRAVENOUS | Status: AC
  Administered 2024-03-08: 200 mg via INTRAVENOUS
  Filled 2024-03-08: qty 10

## 2024-03-08 NOTE — Patient Instructions (Signed)

## 2024-03-09 ENCOUNTER — Inpatient Hospital Stay

## 2024-03-10 ENCOUNTER — Inpatient Hospital Stay

## 2024-03-10 VITALS — BP 104/58 | HR 68 | Temp 98.1°F | Resp 18

## 2024-03-10 DIAGNOSIS — D509 Iron deficiency anemia, unspecified: Secondary | ICD-10-CM | POA: Diagnosis not present

## 2024-03-10 MED ORDER — IRON SUCROSE 20 MG/ML IV SOLN
200.0000 mg | Freq: Once | INTRAVENOUS | Status: AC
  Administered 2024-03-10: 200 mg via INTRAVENOUS
  Filled 2024-03-10: qty 10

## 2024-03-10 NOTE — Patient Instructions (Signed)

## 2024-04-25 ENCOUNTER — Other Ambulatory Visit

## 2024-05-22 ENCOUNTER — Ambulatory Visit: Admitting: Allergy and Immunology

## 2024-05-22 ENCOUNTER — Encounter: Payer: Self-pay | Admitting: Allergy and Immunology

## 2024-05-22 VITALS — BP 136/52 | HR 60 | Temp 98.8°F | Resp 18

## 2024-05-22 DIAGNOSIS — J4550 Severe persistent asthma, uncomplicated: Secondary | ICD-10-CM | POA: Diagnosis not present

## 2024-05-22 DIAGNOSIS — J3089 Other allergic rhinitis: Secondary | ICD-10-CM

## 2024-05-22 DIAGNOSIS — K219 Gastro-esophageal reflux disease without esophagitis: Secondary | ICD-10-CM

## 2024-05-22 MED ORDER — SYMBICORT 160-4.5 MCG/ACT IN AERO: INHALATION_SPRAY | RESPIRATORY_TRACT | 5 refills | Status: AC

## 2024-05-22 NOTE — Progress Notes (Unsigned)
 [place] [facility] [place] [facility] [place] [facility] [name] [facility] [place]   Follow-up Note  Referring Provider: Keren Vicenta BRAVO, MD Primary Provider: Keren Vicenta BRAVO, MD Date of Office Visit: 05/22/2024  Subjective:   Bob Newman (DOB: 05-22-1946) is a 78 y.o. male who [facility] in re-evaluation of the following:  HPI: Bob Newman presents to this clinic in evaluation of asthma, allergic rhinitis, LPR.  Last saw him in this clinic 23 December 2023.  When I last saw him in this clinic he was suffering from a viral respiratory tract infection and apparently that never resolved and then he developed chest pain and then he required hospitalization.  Apparently his cardiac status was stable and he had a normal echocardiogram and at some point after his hospitalization had a stress test which was apparently normal.  He was treated with a multitude of various medications and it sounds as though he received relatively high dose steroids because his sugar ended up very high during that timeframe.  Fortunately, that issue appears to have resolved.  Currently he has a little bit of cough on occasion but he can breathe pretty well.  He does not really use the short acting bronchodilator that often.  He continues on Qvar  twice a day.  He has had very little problems with his nose on his current plan of using a combination of anti-inflammatory nasal steroids, nasal antihistamines, and a low-dose of Afrin.  He believes that his reflux is under very good control while using omeprazole  and famotidine .  Allergies as of 05/22/2024       Reactions   Latex Shortness Of Breath   Levofloxacin Hives, Other (See Comments)   Hives  Was taking Levaquin, Miralax and Spiriva  at same time.  MDs unsure of which caused reaction   Spiriva  Handihaler [tiotropium Bromide ] Hives   Polyethylene Glycol (macrogol) Hives        Medication List    albuterol  108 (90 Base) MCG/ACT  inhaler Commonly known as: VENTOLIN  HFA 2 inhalations every 4-6 hours as needed   amLODipine 5 MG tablet Commonly known as: NORVASC 5 mg daily.   azelastine  0.1 % nasal spray Commonly known as: ASTELIN  Use one spray in each nostril twice daily as directed.   candesartan 16 MG tablet Commonly known as: ATACAND Take 16 mg by mouth daily.   Crestor 20 MG tablet Generic drug: rosuvastatin Take 20 mg by mouth daily.   cyanocobalamin  500 MCG tablet Commonly known as: VITAMIN B12 Take by mouth.   dronedarone 400 MG tablet Commonly known as: MULTAQ Take 400 mg by mouth 2 (two) times daily with a meal.   Eliquis 5 MG Tabs tablet Generic drug: apixaban   famotidine  40 MG tablet Commonly known as: PEPCID  Take 1 tablet (40 mg total) by mouth at bedtime.   ferrous sulfate 325 (65 FE) MG tablet Take 325 mg by mouth daily with breakfast.   fluticasone  50 MCG/ACT nasal spray Commonly known as: FLONASE  One spray in both nostrils in the morning and the evening   furosemide 40 MG tablet Commonly known as: LASIX Take by mouth.   ipratropium-albuterol  0.5-2.5 (3) MG/3ML Soln Commonly known as: DUONEB   metoprolol succinate 50 MG 24 hr tablet Commonly known as: TOPROL-XL Take by mouth. 50 mg in the am and 25 mg in the pm   montelukast  10 MG tablet Commonly known as: SINGULAIR  TAKE ONE TABLET ONCE DAILY   nitroGLYCERIN 0.4 MG SL  tablet Commonly known as: NITROSTAT   omeprazole  40 MG capsule Commonly known as: PRILOSEC TAKE ONE CAPSULE (40 MG TOTAL) BY MOUTH EVERY MORNING AS DIRECTED.   Qvar  RediHaler 80 MCG/ACT inhaler Generic drug: beclomethasone Inhale into the lungs.    Past Medical History:  Diagnosis Date   Allergic rhinoconjunctivitis    Asthma    Atrial fibrillation (HCC)    GERD (gastroesophageal reflux disease)    High blood pressure     Past Surgical History:  Procedure Laterality Date   CHOLECYSTECTOMY     THROMBOENDARTERECTOMY Right 09/22/2016    Pierce Maryelizabeth Finn, MD at [facility]   TONSILLECTOMY      Review of systems negative except as noted in HPI / PMHx or noted below:  Review of Systems  Constitutional: Negative.   HENT: Negative.    Eyes: Negative.   Respiratory: Negative.    Cardiovascular: Negative.   Gastrointestinal: Negative.   Genitourinary: Negative.   Musculoskeletal: Negative.   Skin: Negative.   Neurological: Negative.   Endo/Heme/Allergies: Negative.   Psychiatric/Behavioral: Negative.       Objective:   Vitals:   05/22/24 1357  BP: (!) 136/52  Pulse: 60  Resp: 18  Temp: 98.8 F (37.1 C)  SpO2: 96%          Physical Exam Constitutional:      Appearance: He is not diaphoretic.  HENT:     Head: Normocephalic.     Right Ear: Tympanic membrane, ear canal and external ear normal.     Left Ear: Tympanic membrane, ear canal and external ear normal.     Nose: Nose normal. No mucosal edema or rhinorrhea.     Mouth/Throat:     Pharynx: Uvula midline. No oropharyngeal exudate.  Eyes:     Conjunctiva/sclera: Conjunctivae normal.  Neck:     Thyroid: No thyromegaly.     Trachea: Trachea normal. No tracheal tenderness or tracheal deviation.  Cardiovascular:     Rate and Rhythm: Normal rate and regular rhythm.     Heart sounds: S1 normal and S2 normal. Murmur (Systolic) heard.  Pulmonary:     Effort: No respiratory distress.     Breath sounds: Normal breath sounds. No stridor. No wheezing or rales.  Lymphadenopathy:     Head:     Right side of head: No tonsillar adenopathy.     Left side of head: No tonsillar adenopathy.     Cervical: No cervical adenopathy.  Skin:    Findings: No erythema or rash.     Nails: There is no clubbing.  Neurological:     Mental Status: He is alert.     Diagnostics: Spirometry was performed and demonstrated an FEV1 of 2.14 at 83 % of predicted.  Assessment and Plan:   1. Not well controlled severe persistent asthma (HCC)   2. Other allergic rhinitis    3. LPRD (laryngopharyngeal reflux disease)    1. Continue reflux therapy:   A.  Omeprazole  40 mg - 1 tablet in AM  B.  Famotidine  40 mg - 1 tablet in PM  2. Continue anti-inflammatory therapy for chest:    A. Symbicort  160  - 2 inhalations 2 times per day   B. montelukast  10 mg daily  3. Continue anti-inflammatory therapy for nose:   A. Afrin - 1 spray in single nostril in evening. Alternate nostrils  B. flonase  + astelin  - 1 spray each in both nostrils in evening  D. flonase  + astelin  - 1 spray each  in both nostrils in morning  4. If Needed:   A. Albuterol  + QVAR  - 2 inhalations TOGETHER every 4-6 hours if needed  B. Zyrtec one tablet one time per day   C. nasal saline multiple times a day   D. Mucinex DM 2 tablets twice a day   5. Return in 6 months or earlier if problem  6. Influenza = Tamiflu. Covid = Paxlovid  I am going to have Winn use a combination inhaler and hopefully this will give him better control of his asthma.  If he has recurrent exacerbations as he moves forward we will consider giving him a biologic agent for his asthma.  He will continue on therapy for reflux as noted above.  He will continue on therapy for his upper airway inflammation as noted above.  Assuming he does well we will see him back in his clinic in 6 months or earlier if there is a problem.  Camellia Denis, MD Allergy / Immunology [facility]

## 2024-05-22 NOTE — Patient Instructions (Addendum)
°  1. Continue reflux therapy:   A.  Omeprazole  40 mg - 1 tablet in AM  B.  Famotidine  40 mg - 1 tablet in PM  2. Continue anti-inflammatory therapy for chest:    A. Symbicort  160 - 2 inhalations 2 times per day   B. montelukast  10 mg daily  3. Continue anti-inflammatory therapy for nose:   A. Afrin - 1 spray in single nostril in evening. Alternate nostrils  B. flonase  + astelin  - 1 spray each in both nostrils in evening  D. flonase  + astelin  - 1 spray each in both nostrils in morning  4. If Needed:   A. Albuterol  + QVAR  - 2 inhalations TOGETHER every 4-6 hours if needed  B. Zyrtec one tablet one time per day   C. nasal saline multiple times a day   D. Mucinex DM 2 tablets twice a day   5. Return in 6 months or earlier if problem  6. Influenza = Tamiflu. Covid = Paxlovid

## 2024-05-23 ENCOUNTER — Encounter: Payer: Self-pay | Admitting: Allergy and Immunology

## 2024-05-24 NOTE — Progress Notes (Signed)
 " [facility] at [facility] [address] [place],  [place]  [address] [phone]  Clinic Day:  05/25/2024  Referring physician: Keren Vicenta BRAVO, MD   HISTORY OF PRESENT ILLNESS:  The patient is a 78 y.o. male with iron  deficiency anemia.  He comes in today to reassess his iron  and hemoglobin levels after recently receiving IV iron  In September/October 2025.  Since receiving his IV iron , the patient has been doing okay.  However he has had persistent coughing over these past months, for which no one has been able to definitively figure out what his etiology is.  He had a CT angiogram done in July 2025, which showed no acute pulmonary pathology.  From an anemia standpoint, he continues to deny having any overt forms of blood loss.  PHYSICAL EXAM:  Blood pressure (!) 140/60, pulse 65, temperature 98.6 F (37 C), temperature source Oral, resp. rate 16, height 5' 7 (1.702 m), weight 201 lb 4.8 oz (91.3 kg), SpO2 100%. Wt Readings from Last 3 Encounters:  05/25/24 201 lb 4.8 oz (91.3 kg)  02/24/24 201 lb 14.4 oz (91.6 kg)  11/22/23 208 lb (94.3 kg)   Body mass index is 31.53 kg/m. Performance status (ECOG): 1 - Symptomatic but completely ambulatory Physical Exam Constitutional:      Appearance: Normal appearance. He is not ill-appearing.  HENT:     Mouth/Throat:     Mouth: Mucous membranes are moist.     Pharynx: Oropharynx is clear. No oropharyngeal exudate or posterior oropharyngeal erythema.  Cardiovascular:     Rate and Rhythm: Normal rate and regular rhythm.     Heart sounds: No murmur heard.    No friction rub. No gallop.  Pulmonary:     Effort: Pulmonary effort is normal. No respiratory distress.     Breath sounds: Normal breath sounds. No wheezing, rhonchi or rales.  Abdominal:     General: Bowel sounds are normal. There is no distension.     Palpations: Abdomen is soft. There is no mass.     Tenderness: There is no abdominal tenderness.   Musculoskeletal:        General: No swelling.     Right lower leg: No edema.     Left lower leg: No edema.  Lymphadenopathy:     Cervical: No cervical adenopathy.     Upper Body:     Right upper body: No supraclavicular or axillary adenopathy.     Left upper body: No supraclavicular or axillary adenopathy.     Lower Body: No right inguinal adenopathy. No left inguinal adenopathy.  Skin:    General: Skin is warm.     Coloration: Skin is not jaundiced.     Findings: No lesion or rash.  Neurological:     General: No focal deficit present.     Mental Status: He is alert and oriented to person, place, and time. Mental status is at baseline.  Psychiatric:        Mood and Affect: Mood normal.        Behavior: Behavior normal.        Thought Content: Thought content normal.    LABS:    Latest Reference Range & Units 02/24/24 13:22 05/25/24 13:58  WBC 4.0 - 10.5 K/uL 10.1 12.2 (H)  RBC 4.22 - 5.81 MIL/uL 3.88 (L) 4.02 (L)  Hemoglobin 13.0 - 17.0 g/dL 89.1 (L) 88.6 (L)  HCT 39.0 - 52.0 % 34.3 (L) 35.6 (L)  MCV 80.0 - 100.0  fL 88.4 88.6  MCH 26.0 - 34.0 pg 27.8 28.1  MCHC 30.0 - 36.0 g/dL 68.4 68.2  RDW 88.4 - 84.4 % 14.7 15.3  Platelets 150 - 400 K/uL 310 261  nRBC 0.0 - 0.2 % 0.0 0.0  (H): Data is abnormally high (L): Data is abnormally low     Latest Ref Rng & Units 02/24/2024    1:22 PM 08/24/2023    1:17 PM 08/12/2023   12:00 AM  CMP  Glucose 70 - 99 mg/dL 896  882    BUN 8 - 23 mg/dL 16  25    Creatinine 9.38 - 1.24 mg/dL 8.49  8.77    Sodium 864 - 145 mmol/L 136  135    Potassium 3.5 - 5.1 mmol/L 4.2  4.4    Chloride 98 - 111 mmol/L 101  101    CO2 22 - 32 mmol/L 21  21    Calcium 8.9 - 10.3 mg/dL 9.8  9.8  89.9      Total Protein 6.5 - 8.1 g/dL 6.8  6.4    Total Bilirubin 0.0 - 1.2 mg/dL 0.5  0.4    Alkaline Phos 38 - 126 U/L 49  71    AST 15 - 41 U/L 17  14    ALT 0 - 44 U/L 15  23       This result is from an external source.    Latest Reference Range & Units  02/24/24 13:22 06/06/24 13:27  Iron  45 - 182 ug/dL 35 (L) 53  UIBC ug/dL 599 666  TIBC 749 - 549 ug/dL 565 614  Saturation Ratios 17.9 - 39.5 % 8 (L) 14 (L)  Ferritin 24 - 336 ng/mL 19 (L) 89  (L): Data is abnormally low  ASSESSMENT & PLAN:  Assessment/Plan:  An 78 y.o. male with iron  deficiency anemia.  His labs show that his hemoglobin has only mildly improved since receiving his IV iron .  His iron  parameters are clearly better.  For now his iron  deficiency will be followed conservatively.  I will see this patient back in 4 months for repeat clinical assessment.  The patient understands all the plans discussed today and is in agreement with them.    Kendra Woolford DELENA Kerns, MD       "

## 2024-05-25 ENCOUNTER — Other Ambulatory Visit: Payer: Self-pay | Admitting: Oncology

## 2024-05-25 ENCOUNTER — Inpatient Hospital Stay: Admitting: Oncology

## 2024-05-25 ENCOUNTER — Inpatient Hospital Stay: Attending: Oncology

## 2024-05-25 VITALS — BP 140/60 | HR 65 | Temp 98.6°F | Resp 16 | Ht 67.0 in | Wt 201.3 lb

## 2024-05-25 DIAGNOSIS — D509 Iron deficiency anemia, unspecified: Secondary | ICD-10-CM

## 2024-05-25 DIAGNOSIS — D508 Other iron deficiency anemias: Secondary | ICD-10-CM | POA: Diagnosis not present

## 2024-05-25 LAB — CBC WITH DIFFERENTIAL (CANCER CENTER ONLY)
Abs Immature Granulocytes: 0.07 K/uL (ref 0.00–0.07)
Basophils Absolute: 0 K/uL (ref 0.0–0.1)
Basophils Relative: 0 %
Eosinophils Absolute: 0.1 K/uL (ref 0.0–0.5)
Eosinophils Relative: 1 %
HCT: 35.6 % — ABNORMAL LOW (ref 39.0–52.0)
Hemoglobin: 11.3 g/dL — ABNORMAL LOW (ref 13.0–17.0)
Immature Granulocytes: 1 %
Lymphocytes Relative: 10 %
Lymphs Abs: 1.2 K/uL (ref 0.7–4.0)
MCH: 28.1 pg (ref 26.0–34.0)
MCHC: 31.7 g/dL (ref 30.0–36.0)
MCV: 88.6 fL (ref 80.0–100.0)
Monocytes Absolute: 1.7 K/uL — ABNORMAL HIGH (ref 0.1–1.0)
Monocytes Relative: 14 %
Neutro Abs: 9.2 K/uL — ABNORMAL HIGH (ref 1.7–7.7)
Neutrophils Relative %: 74 %
Platelet Count: 261 K/uL (ref 150–400)
RBC: 4.02 MIL/uL — ABNORMAL LOW (ref 4.22–5.81)
RDW: 15.3 % (ref 11.5–15.5)
WBC Count: 12.2 K/uL — ABNORMAL HIGH (ref 4.0–10.5)
nRBC: 0 % (ref 0.0–0.2)

## 2024-05-29 ENCOUNTER — Other Ambulatory Visit: Payer: Self-pay

## 2024-05-29 ENCOUNTER — Telehealth: Payer: Self-pay

## 2024-05-29 DIAGNOSIS — D509 Iron deficiency anemia, unspecified: Secondary | ICD-10-CM

## 2024-05-29 NOTE — Telephone Encounter (Signed)
 Pt states Dr Ezzard told him to call back for iron  results.

## 2024-06-06 ENCOUNTER — Inpatient Hospital Stay

## 2024-06-06 ENCOUNTER — Other Ambulatory Visit: Payer: Self-pay | Admitting: Oncology

## 2024-06-06 DIAGNOSIS — D509 Iron deficiency anemia, unspecified: Secondary | ICD-10-CM | POA: Diagnosis not present

## 2024-06-06 LAB — CBC WITH DIFFERENTIAL (CANCER CENTER ONLY)
Abs Immature Granulocytes: 0.1 K/uL — ABNORMAL HIGH (ref 0.00–0.07)
Basophils Absolute: 0.1 K/uL (ref 0.0–0.1)
Basophils Relative: 0 %
Eosinophils Absolute: 0.2 K/uL (ref 0.0–0.5)
Eosinophils Relative: 2 %
HCT: 35 % — ABNORMAL LOW (ref 39.0–52.0)
Hemoglobin: 11.1 g/dL — ABNORMAL LOW (ref 13.0–17.0)
Immature Granulocytes: 1 %
Lymphocytes Relative: 15 %
Lymphs Abs: 1.9 K/uL (ref 0.7–4.0)
MCH: 27.7 pg (ref 26.0–34.0)
MCHC: 31.7 g/dL (ref 30.0–36.0)
MCV: 87.3 fL (ref 80.0–100.0)
Monocytes Absolute: 1 K/uL (ref 0.1–1.0)
Monocytes Relative: 8 %
Neutro Abs: 9.7 K/uL — ABNORMAL HIGH (ref 1.7–7.7)
Neutrophils Relative %: 74 %
Platelet Count: 279 K/uL (ref 150–400)
RBC: 4.01 MIL/uL — ABNORMAL LOW (ref 4.22–5.81)
RDW: 15.6 % — ABNORMAL HIGH (ref 11.5–15.5)
WBC Count: 13 K/uL — ABNORMAL HIGH (ref 4.0–10.5)
nRBC: 0 % (ref 0.0–0.2)

## 2024-06-06 LAB — IRON AND TIBC
Iron: 53 ug/dL (ref 45–182)
Saturation Ratios: 14 % — ABNORMAL LOW (ref 17.9–39.5)
TIBC: 385 ug/dL (ref 250–450)
UIBC: 333 ug/dL

## 2024-06-06 LAB — FERRITIN: Ferritin: 89 ng/mL (ref 24–336)

## 2024-06-07 ENCOUNTER — Telehealth: Payer: Self-pay

## 2024-06-07 NOTE — Telephone Encounter (Signed)
"   Latest Reference Range & Units 06/06/24 13:27  Iron  45 - 182 ug/dL 53  UIBC ug/dL 666  TIBC 749 - 549 ug/dL 614  Saturation Ratios 17.9 - 39.5 % 14 (L)  Ferritin 24 - 336 ng/mL 89  (L): Data is abnormally low "

## 2024-06-07 NOTE — Telephone Encounter (Signed)
 Opened in error

## 2024-06-10 ENCOUNTER — Encounter: Payer: Self-pay | Admitting: Oncology

## 2024-06-14 ENCOUNTER — Other Ambulatory Visit: Payer: Self-pay | Admitting: Hematology and Oncology

## 2024-06-14 ENCOUNTER — Inpatient Hospital Stay: Attending: Oncology

## 2024-06-14 DIAGNOSIS — D509 Iron deficiency anemia, unspecified: Secondary | ICD-10-CM

## 2024-06-14 LAB — CBC WITH DIFFERENTIAL (CANCER CENTER ONLY)
Abs Immature Granulocytes: 0.07 K/uL (ref 0.00–0.07)
Basophils Absolute: 0.1 K/uL (ref 0.0–0.1)
Basophils Relative: 0 %
Eosinophils Absolute: 0.2 K/uL (ref 0.0–0.5)
Eosinophils Relative: 1 %
HCT: 38 % — ABNORMAL LOW (ref 39.0–52.0)
Hemoglobin: 11.9 g/dL — ABNORMAL LOW (ref 13.0–17.0)
Immature Granulocytes: 1 %
Lymphocytes Relative: 16 %
Lymphs Abs: 2.2 K/uL (ref 0.7–4.0)
MCH: 27.7 pg (ref 26.0–34.0)
MCHC: 31.3 g/dL (ref 30.0–36.0)
MCV: 88.6 fL (ref 80.0–100.0)
Monocytes Absolute: 1.1 K/uL — ABNORMAL HIGH (ref 0.1–1.0)
Monocytes Relative: 8 %
Neutro Abs: 9.8 K/uL — ABNORMAL HIGH (ref 1.7–7.7)
Neutrophils Relative %: 74 %
Platelet Count: 328 K/uL (ref 150–400)
RBC: 4.29 MIL/uL (ref 4.22–5.81)
RDW: 15.9 % — ABNORMAL HIGH (ref 11.5–15.5)
WBC Count: 13.4 K/uL — ABNORMAL HIGH (ref 4.0–10.5)
nRBC: 0 % (ref 0.0–0.2)

## 2024-06-14 LAB — IRON AND TIBC
Iron: 59 ug/dL (ref 45–182)
Saturation Ratios: 14 % — ABNORMAL LOW (ref 17.9–39.5)
TIBC: 427 ug/dL (ref 250–450)
UIBC: 368 ug/dL

## 2024-06-14 LAB — TSH: TSH: 3.24 u[IU]/mL (ref 0.350–4.500)

## 2024-06-14 LAB — FOLATE: Folate: 12.3 ng/mL

## 2024-06-14 LAB — VITAMIN B12: Vitamin B-12: 1044 pg/mL — ABNORMAL HIGH (ref 180–914)

## 2024-06-14 LAB — FERRITIN: Ferritin: 71 ng/mL (ref 24–336)

## 2024-06-29 ENCOUNTER — Other Ambulatory Visit

## 2024-06-29 ENCOUNTER — Ambulatory Visit: Admitting: Oncology

## 2024-09-22 ENCOUNTER — Inpatient Hospital Stay

## 2024-09-25 ENCOUNTER — Inpatient Hospital Stay: Admitting: Oncology

## 2024-11-20 ENCOUNTER — Ambulatory Visit: Admitting: Allergy and Immunology
